# Patient Record
Sex: Female | Born: 1960 | Race: White | Hispanic: No | Smoking: Former smoker
Health system: Southern US, Community
[De-identification: ages and names within clinical notes are randomized; demographics above are authoritative.]

## PROBLEM LIST (undated history)

## (undated) DIAGNOSIS — F319 Bipolar disorder, unspecified: Secondary | ICD-10-CM

## (undated) DIAGNOSIS — J40 Bronchitis, not specified as acute or chronic: Secondary | ICD-10-CM

## (undated) DIAGNOSIS — F909 Attention-deficit hyperactivity disorder, unspecified type: Secondary | ICD-10-CM

## (undated) DIAGNOSIS — F1910 Other psychoactive substance abuse, uncomplicated: Secondary | ICD-10-CM

## (undated) DIAGNOSIS — F988 Other specified behavioral and emotional disorders with onset usually occurring in childhood and adolescence: Secondary | ICD-10-CM

---

## 1998-01-07 ENCOUNTER — Emergency Department (HOSPITAL_COMMUNITY): Admission: EM | Admit: 1998-01-07 | Discharge: 1998-01-08 | Payer: Self-pay | Admitting: Emergency Medicine

## 1998-01-08 ENCOUNTER — Encounter: Payer: Self-pay | Admitting: Emergency Medicine

## 1998-01-18 ENCOUNTER — Encounter: Admission: RE | Admit: 1998-01-18 | Discharge: 1998-04-18 | Payer: Self-pay | Admitting: Orthopedic Surgery

## 1998-06-18 ENCOUNTER — Emergency Department (HOSPITAL_COMMUNITY): Admission: EM | Admit: 1998-06-18 | Discharge: 1998-06-18 | Payer: Self-pay | Admitting: Emergency Medicine

## 1998-06-18 ENCOUNTER — Encounter: Payer: Self-pay | Admitting: Emergency Medicine

## 2000-08-20 ENCOUNTER — Emergency Department (HOSPITAL_COMMUNITY): Admission: EM | Admit: 2000-08-20 | Discharge: 2000-08-21 | Payer: Self-pay | Admitting: Emergency Medicine

## 2000-11-06 ENCOUNTER — Emergency Department (HOSPITAL_COMMUNITY): Admission: EM | Admit: 2000-11-06 | Discharge: 2000-11-06 | Payer: Self-pay | Admitting: Emergency Medicine

## 2000-11-06 ENCOUNTER — Encounter: Payer: Self-pay | Admitting: Emergency Medicine

## 2001-01-22 ENCOUNTER — Emergency Department (HOSPITAL_COMMUNITY): Admission: EM | Admit: 2001-01-22 | Discharge: 2001-01-22 | Payer: Self-pay | Admitting: Emergency Medicine

## 2004-04-19 ENCOUNTER — Emergency Department (HOSPITAL_COMMUNITY): Admission: EM | Admit: 2004-04-19 | Discharge: 2004-04-19 | Payer: Self-pay | Admitting: Emergency Medicine

## 2004-05-12 ENCOUNTER — Encounter: Admission: RE | Admit: 2004-05-12 | Discharge: 2004-05-12 | Payer: Self-pay | Admitting: Emergency Medicine

## 2005-08-09 ENCOUNTER — Encounter: Payer: Self-pay | Admitting: Emergency Medicine

## 2006-04-14 ENCOUNTER — Emergency Department (HOSPITAL_COMMUNITY): Admission: EM | Admit: 2006-04-14 | Discharge: 2006-04-14 | Payer: Self-pay | Admitting: Emergency Medicine

## 2006-06-16 ENCOUNTER — Emergency Department: Payer: Self-pay | Admitting: Emergency Medicine

## 2006-06-17 ENCOUNTER — Inpatient Hospital Stay: Payer: Self-pay | Admitting: Unknown Physician Specialty

## 2006-06-26 ENCOUNTER — Emergency Department: Payer: Self-pay | Admitting: Emergency Medicine

## 2007-08-20 ENCOUNTER — Emergency Department: Payer: Self-pay | Admitting: Internal Medicine

## 2007-08-20 ENCOUNTER — Other Ambulatory Visit: Payer: Self-pay

## 2008-08-24 ENCOUNTER — Emergency Department (HOSPITAL_COMMUNITY): Admission: EM | Admit: 2008-08-24 | Discharge: 2008-08-24 | Payer: Self-pay | Admitting: Family Medicine

## 2010-01-13 ENCOUNTER — Encounter (INDEPENDENT_AMBULATORY_CARE_PROVIDER_SITE_OTHER): Payer: Self-pay | Admitting: *Deleted

## 2010-01-13 ENCOUNTER — Ambulatory Visit: Payer: Self-pay | Admitting: Internal Medicine

## 2010-01-13 LAB — CONVERTED CEMR LAB
AST: 21 units/L (ref 0–37)
Alkaline Phosphatase: 79 units/L (ref 39–117)
BUN: 9 mg/dL (ref 6–23)
Calcium: 10 mg/dL (ref 8.4–10.5)
Chloride: 104 meq/L (ref 96–112)
Creatinine, Ser: 0.85 mg/dL (ref 0.40–1.20)
Free Thyroxine Index: 2.8 (ref 1.0–3.9)
Glucose, Bld: 69 mg/dL — ABNORMAL LOW (ref 70–99)
HCT: 43.2 % (ref 36.0–46.0)
Hemoglobin: 14 g/dL (ref 12.0–15.0)
LDL Cholesterol: 141 mg/dL — ABNORMAL HIGH (ref 0–99)
Lymphocytes Relative: 37 % (ref 12–46)
Lymphs Abs: 1.7 10*3/uL (ref 0.7–4.0)
MCV: 94.5 fL (ref 78.0–100.0)
Potassium: 3.9 meq/L (ref 3.5–5.3)
RDW: 13.3 % (ref 11.5–15.5)
Sodium: 142 meq/L (ref 135–145)
TSH: 1.358 microintl units/mL (ref 0.350–4.500)
Total Bilirubin: 0.3 mg/dL (ref 0.3–1.2)
VLDL: 28 mg/dL (ref 0–40)
WBC: 4.5 10*3/uL (ref 4.0–10.5)

## 2010-03-08 ENCOUNTER — Emergency Department (HOSPITAL_COMMUNITY)
Admission: EM | Admit: 2010-03-08 | Discharge: 2010-03-08 | Payer: Self-pay | Source: Home / Self Care | Admitting: Family Medicine

## 2010-03-08 ENCOUNTER — Encounter: Admission: RE | Admit: 2010-03-08 | Payer: Self-pay | Admitting: Family Medicine

## 2010-07-09 ENCOUNTER — Emergency Department: Payer: Self-pay | Admitting: Emergency Medicine

## 2010-08-13 ENCOUNTER — Inpatient Hospital Stay: Payer: Self-pay | Admitting: Unknown Physician Specialty

## 2010-12-14 ENCOUNTER — Emergency Department (HOSPITAL_COMMUNITY): Payer: Self-pay

## 2010-12-14 ENCOUNTER — Emergency Department (HOSPITAL_COMMUNITY)
Admission: EM | Admit: 2010-12-14 | Discharge: 2010-12-14 | Disposition: A | Discharge status: Home / Self Care | Payer: Self-pay | Attending: Emergency Medicine | Admitting: Emergency Medicine

## 2010-12-14 DIAGNOSIS — R1031 Right lower quadrant pain: Secondary | ICD-10-CM | POA: Insufficient documentation

## 2010-12-14 DIAGNOSIS — R109 Unspecified abdominal pain: Secondary | ICD-10-CM | POA: Insufficient documentation

## 2010-12-14 DIAGNOSIS — R110 Nausea: Secondary | ICD-10-CM | POA: Insufficient documentation

## 2010-12-14 LAB — URINALYSIS, ROUTINE W REFLEX MICROSCOPIC
Glucose, UA: NEGATIVE mg/dL
Hgb urine dipstick: NEGATIVE
Ketones, ur: NEGATIVE mg/dL
Protein, ur: NEGATIVE mg/dL
Specific Gravity, Urine: 1.011 (ref 1.005–1.030)
Urobilinogen, UA: 0.2 mg/dL (ref 0.0–1.0)

## 2010-12-14 LAB — CBC
HCT: 41.2 % (ref 36.0–46.0)
MCHC: 33.3 g/dL (ref 30.0–36.0)
MCV: 92.6 fL (ref 78.0–100.0)
RBC: 4.45 MIL/uL (ref 3.87–5.11)
RDW: 12.3 % (ref 11.5–15.5)
WBC: 5.1 10*3/uL (ref 4.0–10.5)

## 2010-12-14 LAB — COMPREHENSIVE METABOLIC PANEL
ALT: 23 U/L (ref 0–35)
AST: 29 U/L (ref 0–37)
Alkaline Phosphatase: 98 U/L (ref 39–117)
CO2: 31 mEq/L (ref 19–32)
Calcium: 9.6 mg/dL (ref 8.4–10.5)
Creatinine, Ser: 0.73 mg/dL (ref 0.50–1.10)
GFR calc non Af Amer: >60 mL/min (ref >60)
Total Bilirubin: 0.2 mg/dL — ABNORMAL LOW (ref 0.3–1.2)

## 2010-12-14 LAB — DIFFERENTIAL
Basophils Absolute: 0 10*3/uL (ref 0.0–0.1)
Basophils Relative: 1 % (ref 0–1)
Eosinophils Relative: 2 % (ref 0–5)
Lymphocytes Relative: 42 % (ref 12–46)
Neutro Abs: 2.4 10*3/uL (ref 1.7–7.7)

## 2010-12-14 LAB — LIPASE, BLOOD: Lipase: 41 U/L (ref 11–59)

## 2010-12-14 MED ORDER — IOHEXOL 300 MG/ML  SOLN
100.0000 mL | Freq: Once | INTRAMUSCULAR | Status: AC | PRN
  Administered 2010-12-14: 100 mL via INTRAVENOUS

## 2011-08-01 ENCOUNTER — Encounter (HOSPITAL_COMMUNITY): Payer: Self-pay | Admitting: *Deleted

## 2011-08-01 ENCOUNTER — Emergency Department (HOSPITAL_COMMUNITY): Payer: Self-pay

## 2011-08-01 ENCOUNTER — Emergency Department (HOSPITAL_COMMUNITY)
Admission: EM | Admit: 2011-08-01 | Discharge: 2011-08-01 | Disposition: A | Discharge status: Home / Self Care | Payer: Self-pay | Attending: Emergency Medicine | Admitting: Emergency Medicine

## 2011-08-01 DIAGNOSIS — F17200 Nicotine dependence, unspecified, uncomplicated: Secondary | ICD-10-CM | POA: Insufficient documentation

## 2011-08-01 DIAGNOSIS — F319 Bipolar disorder, unspecified: Secondary | ICD-10-CM | POA: Insufficient documentation

## 2011-08-01 DIAGNOSIS — R062 Wheezing: Secondary | ICD-10-CM | POA: Insufficient documentation

## 2011-08-01 DIAGNOSIS — R059 Cough, unspecified: Secondary | ICD-10-CM | POA: Insufficient documentation

## 2011-08-01 DIAGNOSIS — R070 Pain in throat: Secondary | ICD-10-CM | POA: Insufficient documentation

## 2011-08-01 DIAGNOSIS — IMO0001 Reserved for inherently not codable concepts without codable children: Secondary | ICD-10-CM | POA: Insufficient documentation

## 2011-08-01 DIAGNOSIS — R6883 Chills (without fever): Secondary | ICD-10-CM | POA: Insufficient documentation

## 2011-08-01 DIAGNOSIS — J40 Bronchitis, not specified as acute or chronic: Secondary | ICD-10-CM | POA: Insufficient documentation

## 2011-08-01 DIAGNOSIS — R61 Generalized hyperhidrosis: Secondary | ICD-10-CM | POA: Insufficient documentation

## 2011-08-01 DIAGNOSIS — R05 Cough: Secondary | ICD-10-CM | POA: Insufficient documentation

## 2011-08-01 DIAGNOSIS — J3489 Other specified disorders of nose and nasal sinuses: Secondary | ICD-10-CM | POA: Insufficient documentation

## 2011-08-01 HISTORY — DX: Bipolar disorder, unspecified: F31.9

## 2011-08-01 HISTORY — DX: Bronchitis, not specified as acute or chronic: J40

## 2011-08-01 MED ORDER — ALBUTEROL SULFATE (5 MG/ML) 0.5% IN NEBU
5.0000 mg | INHALATION_SOLUTION | Freq: Once | RESPIRATORY_TRACT | Status: AC
  Administered 2011-08-01: 5 mg via RESPIRATORY_TRACT
  Filled 2011-08-01: qty 1

## 2011-08-01 MED ORDER — PREDNISONE 20 MG PO TABS
60.0000 mg | ORAL_TABLET | Freq: Once | ORAL | Status: AC
  Administered 2011-08-01: 60 mg via ORAL
  Filled 2011-08-01: qty 3

## 2011-08-01 MED ORDER — PREDNISONE 20 MG PO TABS: 60.0000 mg | ORAL_TABLET | Freq: Every day | ORAL | Status: DC

## 2011-08-01 MED ORDER — IPRATROPIUM BROMIDE 0.02 % IN SOLN
0.5000 mg | Freq: Once | RESPIRATORY_TRACT | Status: AC
  Administered 2011-08-01: 0.5 mg via RESPIRATORY_TRACT
  Filled 2011-08-01: qty 2.5

## 2011-08-01 MED ORDER — ALBUTEROL SULFATE HFA 108 (90 BASE) MCG/ACT IN AERS
2.0000 | INHALATION_SPRAY | RESPIRATORY_TRACT | Status: DC
  Administered 2011-08-01: 2 via RESPIRATORY_TRACT
  Filled 2011-08-01: qty 6.7

## 2011-08-01 NOTE — ED Notes (Signed)
Patient reports she has had productive cough for 3 days.  She states she has had pink colored sputum.  Patient has cough noted in triage. Patient also reports she has sob.  She states she is weak.  Patient unsure if she has had a fever

## 2011-08-01 NOTE — ED Provider Notes (Signed)
History     CSN: [id number]  Arrival date & time 08/01/11  7829   First MD Initiated Contact with Patient 08/01/11 0920      Chief Complaint  Patient presents with  . Cough  . Hemoptysis    (Consider location/radiation/quality/duration/timing/severity/associated sxs/prior treatment) Patient is a 51 y.o. female presenting with cough. The history is provided by the patient.  Cough This is a new problem. The current episode started more than 1 week ago. The problem occurs constantly. The problem has been gradually worsening. The cough is productive of sputum. There has been no fever. Associated symptoms include chills, sweats, rhinorrhea, sore throat, myalgias and wheezing. Pertinent negatives include no chest pain and no shortness of breath. She has tried nothing for the symptoms. She is a smoker. Her past medical history is significant for bronchitis.  Pt states her cough is worsening, noted her sputum to be pink today, but states also had a nose bleed last night. Denies taking any medications. Pt is a heavy smoker. Denies chest pain, SOB, denies recent travel, LE swelling.   Past Medical History  Diagnosis Date  . Bronchitis   . Bipolar 1 disorder     History reviewed. No pertinent past surgical history.  No family history on file.  History  Substance Use Topics  . Smoking status: Current Everyday Smoker  . Smokeless tobacco: Not on file  . Alcohol Use: Yes    OB History    Grav Para Term Preterm Abortions TAB SAB Ect Mult Living                  Review of Systems  Constitutional: Positive for chills. Negative for fever.  HENT: Positive for congestion, sore throat and rhinorrhea. Negative for neck stiffness.   Respiratory: Positive for cough and wheezing. Negative for chest tightness and shortness of breath.   Cardiovascular: Negative for chest pain and leg swelling.  Gastrointestinal: Negative.   Genitourinary: Negative.   Musculoskeletal: Positive for myalgias.    Skin: Negative.   Neurological: Negative for dizziness and weakness.    Allergies  Darvocet  Home Medications   Current Outpatient Rx  Name Route Sig Dispense Refill  . TYLENOL CHEST CONGESTION PO Oral Take 2 tablets by mouth every 6 (six) hours as needed. For cold symptoms      BP 111/69  Pulse 102  Temp(Src) 98.4 F (36.9 C) (Oral)  Resp 28  Ht 5' (1.524 m)  Wt 119 lb (53.978 kg)  BMI 23.24 kg/m2  SpO2 98%  Physical Exam  Nursing note and vitals reviewed. Constitutional: She is oriented to person, place, and time. She appears well-developed and well-nourished.  HENT:  Head: Normocephalic and atraumatic.  Right Ear: External ear normal.  Left Ear: External ear normal.  Nose: Nose normal.  Mouth/Throat: Oropharynx is clear and moist.  Eyes: Conjunctivae are normal.  Neck: Neck supple.  Cardiovascular: Normal rate, regular rhythm and normal heart sounds.   Pulmonary/Chest: Effort normal. She has wheezes.       ptin no respiratory distress. Speaking complete sentences. Expiratory wheezing on exam. Pt coughing.  Abdominal: Soft. Bowel sounds are normal. She exhibits no distension. There is no tenderness.  Musculoskeletal: She exhibits no edema.  Neurological: She is alert and oriented to person, place, and time.  Skin: Skin is warm and dry.  Psychiatric: She has a normal mood and affect.    ED Course  Procedures (including critical care time)  Labs Reviewed - No data to display  Dg Chest 2 View  08/01/2011  *RADIOLOGY REPORT*  Clinical Data: Cough, hemoptysis  CHEST - 2 VIEW  Comparison: Chest radiograph 11/29 1011  Findings: Stable cardiac silhouette.  Lungs are hyperinflated.  No suspicious pulmonary nodule.  No consolidation or pneumothorax.  No pleural fluid. There is scoliosis of the upper thoracic spine.  IMPRESSION: No acute cardiopulmonary process.  Emphysematous change.  Original Report Authenticated By: Genevive Bi, M.D.   Pt with persistent cough and  wheezing. She is a heavy smoker. She is not having CP, or SOB, on exam not tachycardic, not hypoxic, doubt PE. Atypical for ACS. Pt received a neb treatment and feeling much better. Will treat with inhaler and steroids. Pt's oxygen sat 98% on RA, she feels like she can go home with an inhaler. I offered her another neb treatment and she refused. Will d/c with follow up.     1. Bronchitis       MDM          Lottie Mussel, PA 08/01/11 1246

## 2011-08-01 NOTE — Discharge Instructions (Signed)
Take albuterol inhaler 2 puffs every 4 hrs. Take over the counter cough medications. Take prednisone daily starting tomorrow. Avoid smoking. Follow up with primary care doctor or urgent care if not improving.   RESOURCE GUIDE  Dental Problems  Patients with Medicaid: [facility] [phone] [address]                                           [phone] [address] Phone:  [phone]                                                  Phone:  [phone]  If unable to pay or uninsured, contact:  [facility] or [facility]. to become qualified for the adult dental clinic.  Chronic Pain Problems Contact [facility]  [phone] Patients need to be referred by their primary care doctor.  Insufficient Money for Medicine Contact [employer]:  call "211" or [facility] [phone].  No Primary Care Doctor Call [facility]  [phone] Other agencies that provide inexpensive medical care    [facility]  [phone]    [facility]  [phone]    [facility]  [phone]    [facility]  [phone]    [facility]  [phone]    [facility]  [phone]  [facility]  [phone] [facility]  [phone] [facility]   [phone] (emergency services [phone])  Substance Abuse Resources [facility]  [phone] [facility] [phone] [facility] [phone] Floyd[facility] Flock [phone] Residential & Outpatient Substance Abuse Program  [phone]  Abuse/Neglect [facility] Child Abuse Hotline 519-575-6657 [facility] Child Abuse Hotline (864) 017-2592 (After Hours)  Emergency Shelter [facility] [phone]  M[facility] [phone] [facility] [phone]  MRSA Hotline #:    670-835-8718    [facility] Resources  [facility]     [employer]                          [facility] Dept. [address] [place]                       [address]      [address]  [place]                                                Cristobal Goldmann Phone:  [phone]  Phone:  [phone]                 Phone:  [phone]  [place] [facility] Phone:  [phone]  [place] Child Abuse Hotline (336) 342-1394 [phone]) [phone] (After Hours)   

## 2011-08-02 NOTE — ED Provider Notes (Signed)
Medical screening examination/treatment/procedure(s) were performed by non-physician practitioner and as supervising physician I was immediately available for consultation/collaboration.  Toy Baker, MD 08/02/11 [phone]

## 2012-01-04 ENCOUNTER — Emergency Department: Payer: Self-pay | Admitting: Emergency Medicine

## 2012-01-04 LAB — CBC
HCT: 41.5 % (ref 35.0–47.0)
HGB: 14.5 g/dL (ref 12.0–16.0)
MCH: 31.6 pg (ref 26.0–34.0)
MCHC: 35 g/dL (ref 32.0–36.0)
MCV: 90 fL (ref 80–100)
Platelet: 281 10*3/uL (ref 150–440)
RBC: 4.59 10*6/uL (ref 3.80–5.20)
RDW: 12.9 % (ref 11.5–14.5)

## 2012-01-04 LAB — COMPREHENSIVE METABOLIC PANEL
Albumin: 3.4 g/dL (ref 3.4–5.0)
Alkaline Phosphatase: 93 U/L (ref 50–136)
Anion Gap: 9 (ref 7–16)
Bilirubin,Total: 0.3 mg/dL (ref 0.2–1.0)
Calcium, Total: 8.8 mg/dL (ref 8.5–10.1)
Chloride: 104 mmol/L (ref 98–107)
Co2: 27 mmol/L (ref 21–32)
Creatinine: 0.87 mg/dL (ref 0.60–1.30)
EGFR (Non-African Amer.): >60
Potassium: 3.6 mmol/L (ref 3.5–5.1)
SGOT(AST): 25 U/L (ref 15–37)

## 2012-01-04 LAB — TROPONIN I: Troponin-I: <0.02 ng/mL

## 2012-05-29 ENCOUNTER — Emergency Department: Payer: Self-pay | Admitting: Emergency Medicine

## 2012-05-29 LAB — BASIC METABOLIC PANEL
Anion Gap: 10 (ref 7–16)
BUN: 6 mg/dL — ABNORMAL LOW (ref 7–18)
Calcium, Total: 8.9 mg/dL (ref 8.5–10.1)
Creatinine: 0.76 mg/dL (ref 0.60–1.30)
Glucose: 95 mg/dL (ref 65–99)
Potassium: 3.6 mmol/L (ref 3.5–5.1)
Sodium: 138 mmol/L (ref 136–145)

## 2012-05-29 LAB — CBC
HCT: 41.1 % (ref 35.0–47.0)
MCHC: 34.3 g/dL (ref 32.0–36.0)
Platelet: 286 10*3/uL (ref 150–440)
RDW: 13.3 % (ref 11.5–14.5)
WBC: 6.8 10*3/uL (ref 3.6–11.0)

## 2012-09-08 IMAGING — CR DG CHEST 2V
2 series · 2 of 2 positions shown · non-contrast
Comparison: August 24, 2008

CLINICAL DATA: Chest and left arm pain; cough; smoker

CHEST - 2 VIEW

[view not recorded (1 of 2)]
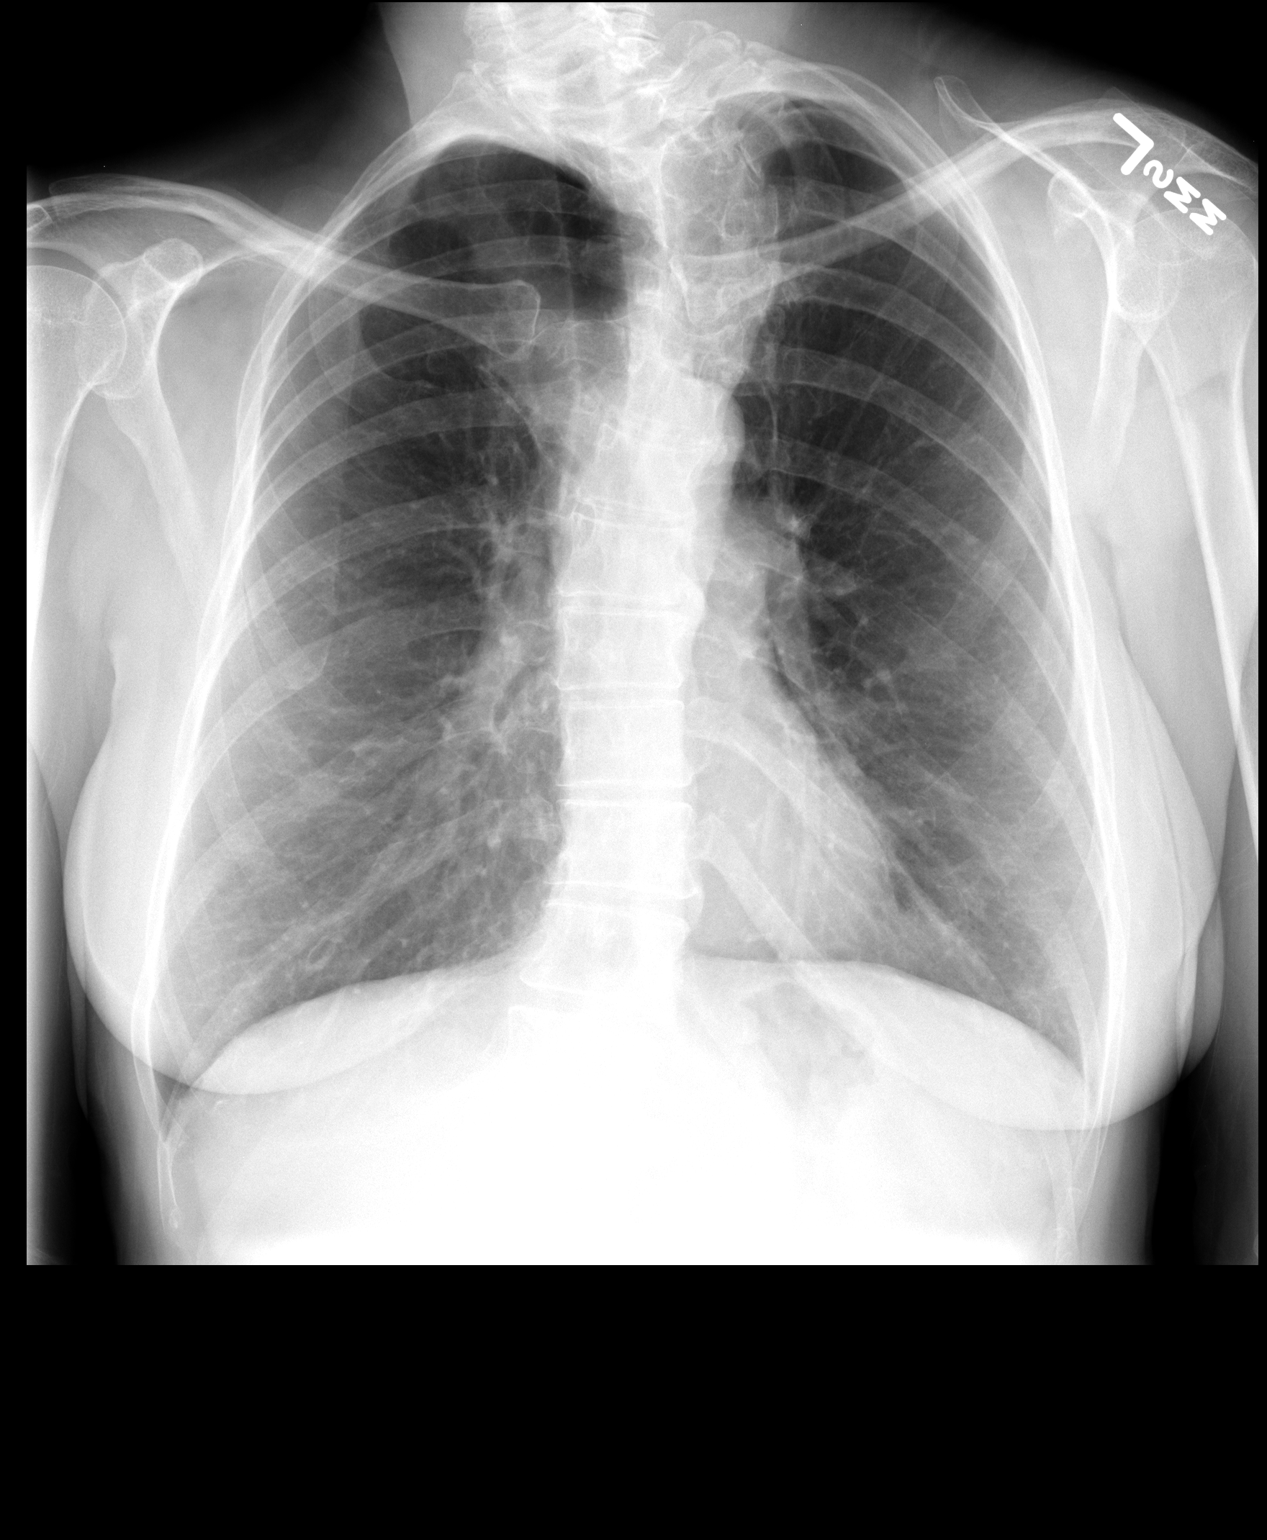

[view not recorded (2 of 2)]
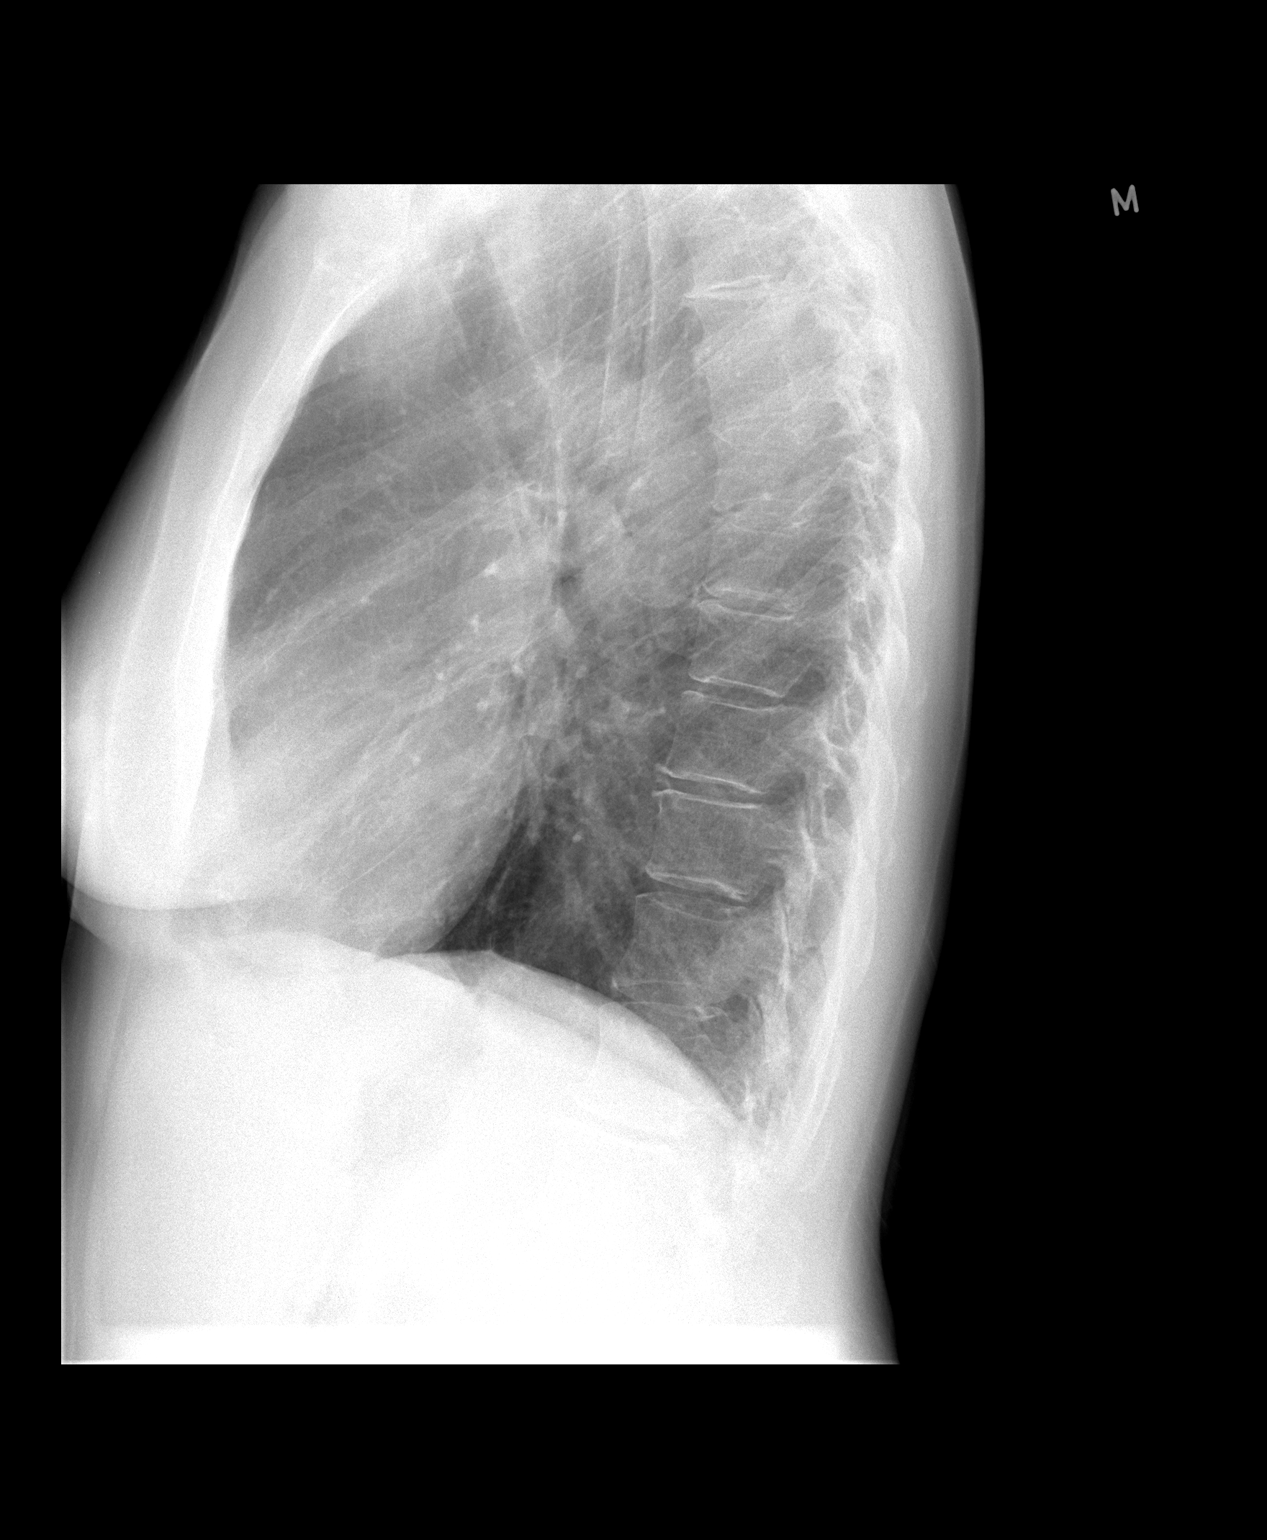

[2 of 2 positions shown; findings below may reference images not displayed]

FINDINGS: The cardiac silhouette, mediastinum, pulmonary
vasculature are within normal limits.  Both lungs are clear.
There is no acute bony abnormality.  Pronounced thoracolumbar
scoliosis is noted.
IMPRESSION: There is no evidence of acute cardiac or pulmonary process.

## 2012-09-23 ENCOUNTER — Other Ambulatory Visit: Payer: Self-pay | Admitting: *Deleted

## 2012-09-23 DIAGNOSIS — N63 Unspecified lump in unspecified breast: Secondary | ICD-10-CM

## 2012-09-24 ENCOUNTER — Ambulatory Visit (HOSPITAL_COMMUNITY): Payer: Self-pay

## 2012-09-30 ENCOUNTER — Emergency Department: Payer: Self-pay | Admitting: Emergency Medicine

## 2012-10-04 ENCOUNTER — Other Ambulatory Visit: Payer: Self-pay

## 2012-11-16 ENCOUNTER — Emergency Department (HOSPITAL_COMMUNITY): Payer: Self-pay

## 2012-11-16 ENCOUNTER — Emergency Department (HOSPITAL_COMMUNITY)
Admission: EM | Admit: 2012-11-16 | Discharge: 2012-11-16 | Disposition: A | Discharge status: Home / Self Care | Payer: No Typology Code available for payment source | Attending: Emergency Medicine | Admitting: Emergency Medicine

## 2012-11-16 ENCOUNTER — Encounter (HOSPITAL_COMMUNITY): Payer: Self-pay

## 2012-11-16 DIAGNOSIS — S59909A Unspecified injury of unspecified elbow, initial encounter: Secondary | ICD-10-CM | POA: Insufficient documentation

## 2012-11-16 DIAGNOSIS — Z8709 Personal history of other diseases of the respiratory system: Secondary | ICD-10-CM | POA: Insufficient documentation

## 2012-11-16 DIAGNOSIS — M25532 Pain in left wrist: Secondary | ICD-10-CM

## 2012-11-16 DIAGNOSIS — S199XXA Unspecified injury of neck, initial encounter: Secondary | ICD-10-CM | POA: Insufficient documentation

## 2012-11-16 DIAGNOSIS — R079 Chest pain, unspecified: Secondary | ICD-10-CM

## 2012-11-16 DIAGNOSIS — S0990XA Unspecified injury of head, initial encounter: Secondary | ICD-10-CM | POA: Insufficient documentation

## 2012-11-16 DIAGNOSIS — S6990XA Unspecified injury of unspecified wrist, hand and finger(s), initial encounter: Secondary | ICD-10-CM | POA: Insufficient documentation

## 2012-11-16 DIAGNOSIS — S0993XA Unspecified injury of face, initial encounter: Secondary | ICD-10-CM | POA: Insufficient documentation

## 2012-11-16 DIAGNOSIS — M25512 Pain in left shoulder: Secondary | ICD-10-CM

## 2012-11-16 DIAGNOSIS — F319 Bipolar disorder, unspecified: Secondary | ICD-10-CM | POA: Insufficient documentation

## 2012-11-16 DIAGNOSIS — S20219A Contusion of unspecified front wall of thorax, initial encounter: Secondary | ICD-10-CM | POA: Insufficient documentation

## 2012-11-16 DIAGNOSIS — Y92410 Unspecified street and highway as the place of occurrence of the external cause: Secondary | ICD-10-CM | POA: Insufficient documentation

## 2012-11-16 DIAGNOSIS — F17200 Nicotine dependence, unspecified, uncomplicated: Secondary | ICD-10-CM | POA: Insufficient documentation

## 2012-11-16 DIAGNOSIS — S46909A Unspecified injury of unspecified muscle, fascia and tendon at shoulder and upper arm level, unspecified arm, initial encounter: Secondary | ICD-10-CM | POA: Insufficient documentation

## 2012-11-16 DIAGNOSIS — J4489 Other specified chronic obstructive pulmonary disease: Secondary | ICD-10-CM | POA: Insufficient documentation

## 2012-11-16 DIAGNOSIS — S4980XA Other specified injuries of shoulder and upper arm, unspecified arm, initial encounter: Secondary | ICD-10-CM | POA: Insufficient documentation

## 2012-11-16 DIAGNOSIS — Y9389 Activity, other specified: Secondary | ICD-10-CM | POA: Insufficient documentation

## 2012-11-16 DIAGNOSIS — T148XXA Other injury of unspecified body region, initial encounter: Secondary | ICD-10-CM

## 2012-11-16 DIAGNOSIS — J449 Chronic obstructive pulmonary disease, unspecified: Secondary | ICD-10-CM | POA: Insufficient documentation

## 2012-11-16 DIAGNOSIS — M542 Cervicalgia: Secondary | ICD-10-CM

## 2012-11-16 MED ORDER — OXYCODONE-ACETAMINOPHEN 5-325 MG PO TABS
1.0000 | ORAL_TABLET | Freq: Once | ORAL | Status: AC
  Administered 2012-11-16: 1 via ORAL
  Filled 2012-11-16: qty 1

## 2012-11-16 MED ORDER — OXYCODONE-ACETAMINOPHEN 5-325 MG PO TABS: 1.0000 | ORAL_TABLET | ORAL | Status: DC | PRN

## 2012-11-16 MED ORDER — IBUPROFEN 600 MG PO TABS: 600.0000 mg | ORAL_TABLET | Freq: Four times a day (QID) | ORAL | Status: DC | PRN

## 2012-11-16 MED ORDER — ONDANSETRON 4 MG PO TBDP
4.0000 mg | ORAL_TABLET | Freq: Once | ORAL | Status: AC
  Administered 2012-11-16: 4 mg via ORAL
  Filled 2012-11-16: qty 1

## 2012-11-16 MED ORDER — ONDANSETRON HCL 4 MG PO TABS: 4.0000 mg | ORAL_TABLET | Freq: Four times a day (QID) | ORAL | Status: DC
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
## 2012-11-16 NOTE — ED Notes (Signed)
The pt is out of the dept.  ???? IN XRAY
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
## 2012-11-24 ENCOUNTER — Emergency Department (HOSPITAL_COMMUNITY): Payer: Self-pay

## 2012-11-24 ENCOUNTER — Emergency Department (HOSPITAL_COMMUNITY)
Admission: EM | Admit: 2012-11-24 | Discharge: 2012-11-24 | Disposition: A | Discharge status: Home / Self Care | Payer: Self-pay | Attending: Emergency Medicine | Admitting: Emergency Medicine

## 2012-11-24 ENCOUNTER — Encounter (HOSPITAL_COMMUNITY): Payer: Self-pay | Admitting: Emergency Medicine

## 2012-11-24 DIAGNOSIS — R079 Chest pain, unspecified: Secondary | ICD-10-CM

## 2012-11-24 DIAGNOSIS — R05 Cough: Secondary | ICD-10-CM | POA: Insufficient documentation

## 2012-11-24 DIAGNOSIS — Z8659 Personal history of other mental and behavioral disorders: Secondary | ICD-10-CM | POA: Insufficient documentation

## 2012-11-24 DIAGNOSIS — R059 Cough, unspecified: Secondary | ICD-10-CM | POA: Insufficient documentation

## 2012-11-24 DIAGNOSIS — F17200 Nicotine dependence, unspecified, uncomplicated: Secondary | ICD-10-CM | POA: Insufficient documentation

## 2012-11-24 DIAGNOSIS — R0789 Other chest pain: Secondary | ICD-10-CM | POA: Insufficient documentation

## 2012-11-24 DIAGNOSIS — M546 Pain in thoracic spine: Secondary | ICD-10-CM | POA: Insufficient documentation

## 2012-11-24 DIAGNOSIS — Z87828 Personal history of other (healed) physical injury and trauma: Secondary | ICD-10-CM | POA: Insufficient documentation

## 2012-11-24 DIAGNOSIS — Z720 Tobacco use: Secondary | ICD-10-CM

## 2012-11-24 DIAGNOSIS — J441 Chronic obstructive pulmonary disease with (acute) exacerbation: Secondary | ICD-10-CM | POA: Insufficient documentation

## 2012-11-24 DIAGNOSIS — J449 Chronic obstructive pulmonary disease, unspecified: Secondary | ICD-10-CM

## 2012-11-24 LAB — CBC
HCT: 45.3 % (ref 36.0–46.0)
Hemoglobin: 15.4 g/dL — ABNORMAL HIGH (ref 12.0–15.0)
MCH: 30.9 pg (ref 26.0–34.0)
MCV: 91 fL (ref 78.0–100.0)
RBC: 4.98 MIL/uL (ref 3.87–5.11)
WBC: 5.6 10*3/uL (ref 4.0–10.5)

## 2012-11-24 LAB — POCT I-STAT TROPONIN I: Troponin i, poc: 0.01 ng/mL (ref 0.00–0.08)

## 2012-11-24 LAB — BASIC METABOLIC PANEL
CO2: 25 mEq/L (ref 19–32)
Calcium: 9.9 mg/dL (ref 8.4–10.5)
Chloride: 104 mEq/L (ref 96–112)
Creatinine, Ser: 0.73 mg/dL (ref 0.50–1.10)
Glucose, Bld: 63 mg/dL — ABNORMAL LOW (ref 70–99)

## 2012-11-24 LAB — PRO B NATRIURETIC PEPTIDE: Pro B Natriuretic peptide (BNP): 57.7 pg/mL (ref 0–125)

## 2012-11-24 MED ORDER — TRAMADOL HCL 50 MG PO TABS: 50.0000 mg | ORAL_TABLET | Freq: Four times a day (QID) | ORAL | Status: DC | PRN

## 2012-11-24 MED ORDER — IPRATROPIUM BROMIDE 0.02 % IN SOLN
0.5000 mg | Freq: Once | RESPIRATORY_TRACT | Status: AC
  Administered 2012-11-24: 0.5 mg via RESPIRATORY_TRACT
  Filled 2012-11-24: qty 2.5

## 2012-11-24 MED ORDER — ALBUTEROL SULFATE (5 MG/ML) 0.5% IN NEBU
5.0000 mg | INHALATION_SOLUTION | Freq: Once | RESPIRATORY_TRACT | Status: AC
  Administered 2012-11-24: 5 mg via RESPIRATORY_TRACT
  Filled 2012-11-24: qty 1

## 2012-11-24 MED ORDER — TRAMADOL HCL 50 MG PO TABS
50.0000 mg | ORAL_TABLET | Freq: Once | ORAL | Status: AC
  Administered 2012-11-24: 50 mg via ORAL
  Filled 2012-11-24: qty 1

## 2012-11-24 MED ORDER — ALBUTEROL SULFATE HFA 108 (90 BASE) MCG/ACT IN AERS
2.0000 | INHALATION_SPRAY | RESPIRATORY_TRACT | Status: DC | PRN
  Administered 2012-11-24: 2 via RESPIRATORY_TRACT
  Filled 2012-11-24: qty 6.7

## 2012-11-24 NOTE — ED Notes (Signed)
Pt states "i was given Percocet, IBU, the percocet helped a lot, but i had to take two of them at a time, my pain is 5 times worse"

## 2012-11-24 NOTE — ED Provider Notes (Signed)
Medical screening examination/treatment/procedure(s) were performed by non-physician practitioner and as supervising physician I was immediately available for consultation/collaboration.   Richardean Canal, MD 11/24/12 [phone]
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
## 2012-11-25 LAB — GLUCOSE, CAPILLARY: Glucose-Capillary: 114 mg/dL — ABNORMAL HIGH (ref 70–99)

## 2013-01-09 IMAGING — CR DG CHEST 1V PORT
1 series · 1 of 1 positions shown · non-contrast
Comparison: none

REASON FOR EXAM: chest pain
COMMENTS:

PROCEDURE:     DXR - DXR PORTABLE CHEST SINGLE VIEW  - July 09, 2010  [DATE]
RESULT:     No acute cardiopulmonary disease.

[view not recorded]
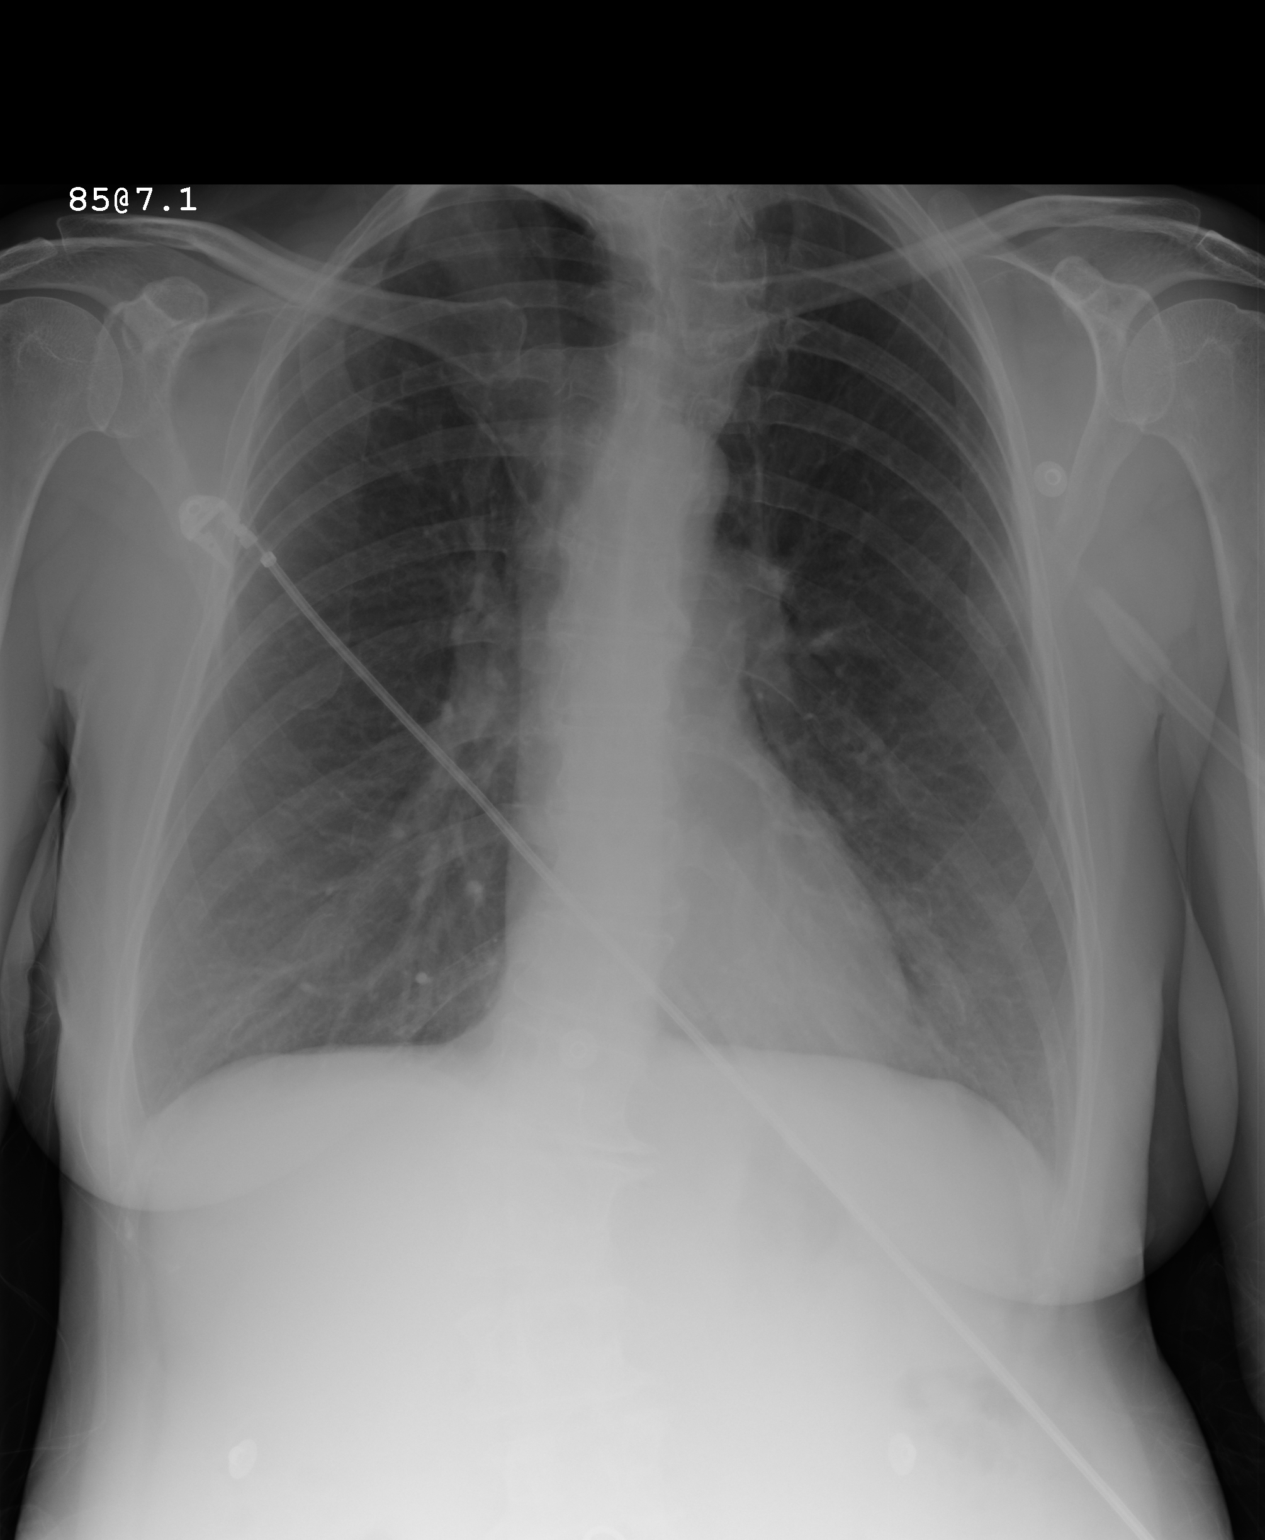

[1 of 1 positions shown; findings below may reference images not displayed]

IMPRESSION: No acute abnormality.

## 2013-04-08 ENCOUNTER — Emergency Department: Payer: Self-pay | Admitting: Emergency Medicine

## 2013-04-08 LAB — BASIC METABOLIC PANEL
Anion Gap: 3 — ABNORMAL LOW (ref 7–16)
BUN: 5 mg/dL — ABNORMAL LOW (ref 7–18)
Calcium, Total: 8.6 mg/dL (ref 8.5–10.1)
Co2: 23 mmol/L (ref 21–32)
Creatinine: 0.65 mg/dL (ref 0.60–1.30)
EGFR (African American): >60

## 2013-04-08 LAB — CBC
HCT: 41.7 % (ref 35.0–47.0)
HGB: 14.4 g/dL (ref 12.0–16.0)
MCH: 31 pg (ref 26.0–34.0)
MCV: 90 fL (ref 80–100)
RBC: 4.65 10*6/uL (ref 3.80–5.20)
RDW: 13.4 % (ref 11.5–14.5)
WBC: 7.8 10*3/uL (ref 3.6–11.0)

## 2013-04-08 LAB — TROPONIN I: Troponin-I: <0.02 ng/mL

## 2014-04-13 ENCOUNTER — Encounter (HOSPITAL_COMMUNITY): Payer: Self-pay | Admitting: Emergency Medicine

## 2014-04-13 ENCOUNTER — Emergency Department (HOSPITAL_COMMUNITY)
Admission: EM | Admit: 2014-04-13 | Discharge: 2014-04-14 | Disposition: A | Discharge status: Home / Self Care | Payer: Medicaid Other | Attending: Emergency Medicine | Admitting: Emergency Medicine

## 2014-04-13 DIAGNOSIS — Z720 Tobacco use: Secondary | ICD-10-CM | POA: Insufficient documentation

## 2014-04-13 DIAGNOSIS — Z8709 Personal history of other diseases of the respiratory system: Secondary | ICD-10-CM | POA: Insufficient documentation

## 2014-04-13 DIAGNOSIS — R0602 Shortness of breath: Secondary | ICD-10-CM | POA: Insufficient documentation

## 2014-04-13 DIAGNOSIS — A5901 Trichomonal vulvovaginitis: Secondary | ICD-10-CM

## 2014-04-13 DIAGNOSIS — F1420 Cocaine dependence, uncomplicated: Secondary | ICD-10-CM | POA: Insufficient documentation

## 2014-04-13 DIAGNOSIS — F319 Bipolar disorder, unspecified: Secondary | ICD-10-CM | POA: Diagnosis present

## 2014-04-13 DIAGNOSIS — R0789 Other chest pain: Secondary | ICD-10-CM | POA: Insufficient documentation

## 2014-04-13 DIAGNOSIS — F1010 Alcohol abuse, uncomplicated: Secondary | ICD-10-CM

## 2014-04-13 DIAGNOSIS — Z79899 Other long term (current) drug therapy: Secondary | ICD-10-CM | POA: Insufficient documentation

## 2014-04-13 DIAGNOSIS — R062 Wheezing: Secondary | ICD-10-CM | POA: Insufficient documentation

## 2014-04-13 DIAGNOSIS — Z3202 Encounter for pregnancy test, result negative: Secondary | ICD-10-CM | POA: Insufficient documentation

## 2014-04-13 DIAGNOSIS — F1920 Other psychoactive substance dependence, uncomplicated: Secondary | ICD-10-CM

## 2014-04-13 DIAGNOSIS — F1910 Other psychoactive substance abuse, uncomplicated: Secondary | ICD-10-CM

## 2014-04-13 DIAGNOSIS — F1020 Alcohol dependence, uncomplicated: Secondary | ICD-10-CM | POA: Diagnosis present

## 2014-04-13 DIAGNOSIS — F1220 Cannabis dependence, uncomplicated: Secondary | ICD-10-CM | POA: Insufficient documentation

## 2014-04-13 LAB — RAPID URINE DRUG SCREEN, HOSP PERFORMED
AMPHETAMINES: NOT DETECTED
BARBITURATES: NOT DETECTED
BENZODIAZEPINES: NOT DETECTED
COCAINE: POSITIVE — AB
Opiates: NOT DETECTED
Tetrahydrocannabinol: POSITIVE — AB

## 2014-04-13 LAB — CBC WITH DIFFERENTIAL/PLATELET
Basophils Absolute: 0 10*3/uL (ref 0.0–0.1)
Basophils Relative: 1 % (ref 0–1)
Eosinophils Absolute: 0 10*3/uL (ref 0.0–0.7)
Eosinophils Relative: 1 % (ref 0–5)
HCT: 39.3 % (ref 36.0–46.0)
Hemoglobin: 13 g/dL (ref 12.0–15.0)
LYMPHS PCT: 30 % (ref 12–46)
Lymphs Abs: 1.6 10*3/uL (ref 0.7–4.0)
MCH: 31.3 pg (ref 26.0–34.0)
MCHC: 33.1 g/dL (ref 30.0–36.0)
MCV: 94.5 fL (ref 78.0–100.0)
Monocytes Absolute: 0.2 10*3/uL (ref 0.1–1.0)
Monocytes Relative: 4 % (ref 3–12)
NEUTROS PCT: 64 % (ref 43–77)
Neutro Abs: 3.4 10*3/uL (ref 1.7–7.7)
PLATELETS: 304 10*3/uL (ref 150–400)
RBC: 4.16 MIL/uL (ref 3.87–5.11)
RDW: 13.2 % (ref 11.5–15.5)
WBC: 5.2 10*3/uL (ref 4.0–10.5)

## 2014-04-13 LAB — COMPREHENSIVE METABOLIC PANEL
ALBUMIN: 3.8 g/dL (ref 3.5–5.2)
ALK PHOS: 81 U/L (ref 39–117)
ALT: 21 U/L (ref 0–35)
AST: 33 U/L (ref 0–37)
Anion gap: 7 (ref 5–15)
BILIRUBIN TOTAL: 0.3 mg/dL (ref 0.3–1.2)
BUN: 12 mg/dL (ref 6–23)
CO2: 21 mmol/L (ref 19–32)
Calcium: 8.6 mg/dL (ref 8.4–10.5)
Chloride: 107 mEq/L (ref 96–112)
Creatinine, Ser: 0.61 mg/dL (ref 0.50–1.10)
GFR calc Af Amer: >90 mL/min (ref >90)
Glucose, Bld: 114 mg/dL — ABNORMAL HIGH (ref 70–99)
POTASSIUM: 3.7 mmol/L (ref 3.5–5.1)
SODIUM: 135 mmol/L (ref 135–145)
Total Protein: 6.5 g/dL (ref 6.0–8.3)

## 2014-04-13 LAB — URINALYSIS, ROUTINE W REFLEX MICROSCOPIC
BILIRUBIN URINE: NEGATIVE
GLUCOSE, UA: NEGATIVE mg/dL
HGB URINE DIPSTICK: NEGATIVE
Ketones, ur: NEGATIVE mg/dL
Leukocytes, UA: NEGATIVE
Nitrite: NEGATIVE
PROTEIN: NEGATIVE mg/dL
Specific Gravity, Urine: 1.013 (ref 1.005–1.030)
Urobilinogen, UA: 0.2 mg/dL (ref 0.0–1.0)
pH: 5.5 (ref 5.0–8.0)

## 2014-04-13 LAB — WET PREP, GENITAL: YEAST WET PREP: NONE SEEN

## 2014-04-13 LAB — POC URINE PREG, ED: PREG TEST UR: NEGATIVE

## 2014-04-13 LAB — ETHANOL: ALCOHOL ETHYL (B): 69 mg/dL — AB (ref 0–9)

## 2014-04-13 MED ORDER — ALBUTEROL SULFATE HFA 108 (90 BASE) MCG/ACT IN AERS
2.0000 | INHALATION_SPRAY | RESPIRATORY_TRACT | Status: DC | PRN
  Administered 2014-04-14: 2 via RESPIRATORY_TRACT
  Filled 2014-04-13 (×2): qty 6.7

## 2014-04-13 MED ORDER — CEFTRIAXONE SODIUM 250 MG IJ SOLR
250.0000 mg | Freq: Once | INTRAMUSCULAR | Status: AC
  Administered 2014-04-13: 250 mg via INTRAMUSCULAR
  Filled 2014-04-13: qty 250

## 2014-04-13 MED ORDER — NAPROXEN 500 MG PO TABS: 500.0000 mg | ORAL_TABLET | Freq: Two times a day (BID) | ORAL | Status: DC | PRN

## 2014-04-13 MED ORDER — ONDANSETRON 4 MG PO TBDP: 4.0000 mg | ORAL_TABLET | Freq: Four times a day (QID) | ORAL | Status: DC | PRN

## 2014-04-13 MED ORDER — CLONIDINE HCL 0.1 MG PO TABS
0.1000 mg | ORAL_TABLET | Freq: Four times a day (QID) | ORAL | Status: DC
  Administered 2014-04-13 (×2): 0.1 mg via ORAL
  Filled 2014-04-13 (×2): qty 1

## 2014-04-13 MED ORDER — METHOCARBAMOL 500 MG PO TABS: 500.0000 mg | ORAL_TABLET | Freq: Three times a day (TID) | ORAL | Status: DC | PRN

## 2014-04-13 MED ORDER — DICYCLOMINE HCL 20 MG PO TABS
20.0000 mg | ORAL_TABLET | Freq: Four times a day (QID) | ORAL | Status: DC | PRN
  Filled 2014-04-13: qty 1

## 2014-04-13 MED ORDER — CLONIDINE HCL 0.1 MG PO TABS: 0.1000 mg | ORAL_TABLET | Freq: Every day | ORAL | Status: DC

## 2014-04-13 MED ORDER — ONDANSETRON HCL 4 MG PO TABS: 4.0000 mg | ORAL_TABLET | Freq: Three times a day (TID) | ORAL | Status: DC | PRN

## 2014-04-13 MED ORDER — LIDOCAINE HCL 1 % IJ SOLN
INTRAMUSCULAR | Status: AC
  Administered 2014-04-13: 20:00:00
  Filled 2014-04-13: qty 20

## 2014-04-13 MED ORDER — CLONIDINE HCL 0.1 MG PO TABS: 0.1000 mg | ORAL_TABLET | ORAL | Status: DC

## 2014-04-13 MED ORDER — LORAZEPAM 1 MG PO TABS: 0.0000 mg | ORAL_TABLET | Freq: Two times a day (BID) | ORAL | Status: DC

## 2014-04-13 MED ORDER — LORAZEPAM 1 MG PO TABS
0.0000 mg | ORAL_TABLET | Freq: Four times a day (QID) | ORAL | Status: DC
  Administered 2014-04-13 – 2014-04-14 (×3): 2 mg via ORAL
  Filled 2014-04-13 (×3): qty 2

## 2014-04-13 MED ORDER — VITAMIN B-1 100 MG PO TABS
100.0000 mg | ORAL_TABLET | Freq: Every day | ORAL | Status: DC
  Administered 2014-04-13 – 2014-04-14 (×2): 100 mg via ORAL
  Filled 2014-04-13 (×2): qty 1

## 2014-04-13 MED ORDER — IBUPROFEN 200 MG PO TABS
600.0000 mg | ORAL_TABLET | Freq: Three times a day (TID) | ORAL | Status: DC | PRN
  Administered 2014-04-14: 600 mg via ORAL
  Filled 2014-04-13: qty 3

## 2014-04-13 MED ORDER — HYDROXYZINE HCL 25 MG PO TABS: 25.0000 mg | ORAL_TABLET | Freq: Four times a day (QID) | ORAL | Status: DC | PRN

## 2014-04-13 MED ORDER — ZOLPIDEM TARTRATE 5 MG PO TABS: 5.0000 mg | ORAL_TABLET | Freq: Every evening | ORAL | Status: DC | PRN

## 2014-04-13 MED ORDER — AZITHROMYCIN 250 MG PO TABS
1000.0000 mg | ORAL_TABLET | Freq: Once | ORAL | Status: AC
  Administered 2014-04-13: 1000 mg via ORAL
  Filled 2014-04-13: qty 4

## 2014-04-13 MED ORDER — LOPERAMIDE HCL 2 MG PO CAPS
2.0000 mg | ORAL_CAPSULE | ORAL | Status: DC | PRN
  Filled 2014-04-13: qty 1

## 2014-04-13 MED ORDER — NICOTINE 21 MG/24HR TD PT24
21.0000 mg | MEDICATED_PATCH | Freq: Every day | TRANSDERMAL | Status: DC
  Administered 2014-04-13 – 2014-04-14 (×2): 21 mg via TRANSDERMAL
  Filled 2014-04-13 (×2): qty 1

## 2014-04-13 MED ORDER — THIAMINE HCL 100 MG/ML IJ SOLN: 100.0000 mg | Freq: Every day | INTRAMUSCULAR | Status: DC

## 2014-04-13 NOTE — ED Notes (Signed)
Pt reports to ED for ETOH detox, states she drinks all day every day, also detox from crack cocaine, pt states she also smokes marijuana and cigarrettes. Pt states she also takes ibuprofen everyday. Pt states she drank a 40 oz bottle of beer and 20 oz crack cocaine today around 1400.
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
## 2014-04-13 NOTE — ED Provider Notes (Signed)
CSN: [id number]     Arrival date & time 04/13/14  1432 History   First MD Initiated Contact with Patient 04/13/14 1519     No chief complaint on file.    (Consider location/radiation/quality/duration/timing/severity/associated sxs/prior Treatment) HPI Brittany Sanford is a 54 y.o. female who presents to ED requesting detox from alcohol, cocaine. States has been using for more than 20years but frequency and use worsened after the death of her mother 6 months ago. States drinks daily, all day long. Reports shakiness and abdominal pain if does not drink. States uses crack cocaine daily, last used at 2pm. Also uses marijuana. Pt currently complaining of a cough and vaginal discharge. Has been using mucinex with some relief in her cough. No other complaints. No Si or HI.    Past Medical History  Diagnosis Date  . Bronchitis   . Bipolar 1 disorder    History reviewed. No pertinent past surgical history. History reviewed. No pertinent family history. History  Substance Use Topics  . Smoking status: Current Every Day Smoker  . Smokeless tobacco: Not on file  . Alcohol Use: Yes   OB History    No data available     Review of Systems  Constitutional: Negative for fever and chills.  Respiratory: Positive for chest tightness, shortness of breath and wheezing. Negative for cough.   Cardiovascular: Negative for chest pain, palpitations and leg swelling.  Gastrointestinal: Negative for nausea, vomiting, abdominal pain and diarrhea.  Genitourinary: Positive for vaginal discharge. Negative for dysuria, flank pain, vaginal bleeding, vaginal pain and pelvic pain.  Musculoskeletal: Negative for myalgias, arthralgias, neck pain and neck stiffness.  Skin: Negative for rash.  Neurological: Negative for dizziness, weakness and headaches.  All other systems reviewed and are negative.     Allergies  Darvocet  Home Medications   Prior to Admission medications   Medication Sig Start Date End Date  Taking? Authorizing Provider  Brompheniramine-Phenylephrine (COLD & ALLERGY PO) Take 1 tablet by mouth 3 (three) times daily.   Yes Historical Provider, MD  dextromethorphan-guaiFENesin (MUCINEX DM) 30-600 MG per 12 hr tablet Take 1 tablet by mouth 2 (two) times daily as needed for cough.    Yes Historical Provider, MD  ibuprofen (ADVIL,MOTRIN) 600 MG tablet Take 1 tablet (600 mg total) by mouth every 6 (six) hours as needed for pain. Patient not taking: Reported on 04/13/2014 11/16/12   Kristen N Ward, DO  ondansetron (ZOFRAN) 4 MG tablet Take 1 tablet (4 mg total) by mouth every 6 (six) hours. Patient not taking: Reported on 04/13/2014 11/16/12   Layla Maw Ward, DO  oxyCODONE-acetaminophen (PERCOCET/ROXICET) 5-325 MG per tablet Take 1 tablet by mouth every 4 (four) hours as needed for pain. Patient not taking: Reported on 04/13/2014 11/16/12   Layla Maw Ward, DO  traMADol (ULTRAM) 50 MG tablet Take 1 tablet (50 mg total) by mouth every 6 (six) hours as needed for pain. Patient not taking: Reported on 04/13/2014 11/24/12   Melvenia Beam A Upstill, PA-C   BP 115/70 mmHg  Pulse 95  Temp(Src) 97.8 F (36.6 C) (Oral)  Resp 16  SpO2 93% Physical Exam  Constitutional: She is oriented to person, place, and time. She appears well-developed and well-nourished. No distress.  HENT:  Head: Normocephalic.  Eyes: Conjunctivae are normal.  Neck: Neck supple.  Cardiovascular: Normal rate, regular rhythm and normal heart sounds.   Pulmonary/Chest: Effort normal. No respiratory distress. She has wheezes. She has no rales.  Diffuse expiratory wheezes bilaterally  Abdominal:  Soft. Bowel sounds are normal. She exhibits no distension. There is no tenderness. There is no rebound.  Genitourinary:  Normal external genitalia. Normal vaginal canal. Small thin white discharge. Cervix is normal, closed. No CMT. No uterine or adnexal tenderness. No masses palpated.    Musculoskeletal: She exhibits no edema.  Neurological: She is alert  and oriented to person, place, and time.  Skin: Skin is warm and dry.  Psychiatric: She has a normal mood and affect. Her behavior is normal.  Nursing note and vitals reviewed.   ED Course  Procedures (including critical care time) Labs Review Labs Reviewed  WET PREP, GENITAL - Abnormal; Notable for the following:    Trich, Wet Prep MODERATE (*)    Clue Cells Wet Prep HPF POC FEW (*)    WBC, Wet Prep HPF POC RARE (*)    All other components within normal limits  COMPREHENSIVE METABOLIC PANEL - Abnormal; Notable for the following:    Glucose, Bld 114 (*)    All other components within normal limits  ETHANOL - Abnormal; Notable for the following:    Alcohol, Ethyl (B) 69 (*)    All other components within normal limits  URINE RAPID DRUG SCREEN (HOSP PERFORMED) - Abnormal; Notable for the following:    Cocaine POSITIVE (*)    Tetrahydrocannabinol POSITIVE (*)    All other components within normal limits  GC/CHLAMYDIA PROBE AMP  CBC WITH DIFFERENTIAL  URINALYSIS, ROUTINE W REFLEX MICROSCOPIC  RPR  HIV ANTIBODY (ROUTINE TESTING)  POC URINE PREG, ED    Imaging Review No results found.   EKG Interpretation None      MDM   Final diagnoses:  Alcohol abuse  Polysubstance abuse  Polysubstance dependence  Trichomonal vaginitis   Patient here requesting detox from alcohol and cocaine. She is nontoxic appearing. No signs of withdrawal at this time. CIWA and clonidine protocol started. TTS consulted. Patient requesting pelvic exam, states having some vaginal discharge. We will perform  6:45 PM Pt's wet prep positive for trichomonus. Covered with rocephin  IM and zithromax 1g po for possible cervicitis. Will hold off on flagyl due to intoxication.   PT medically cleared. Here voluntarily. No SI or HI.  Needs flagyl upon discharge for Trichomonas  Lottie Mussel, PA-C 04/14/14 2208  Toy Baker, MD 04/16/14 1321

## 2014-04-13 NOTE — BH Assessment (Signed)
Initiated referrals to [facility] and [facility]. Per Elyn Aquas has no beds tonight but may in AM. Per Molly Maduro at [facility] they have beds. Sent referrals to both facilities.    Clista Bernhardt, [facility] Triage Specialist 04/13/2014 10:17 PM

## 2014-04-13 NOTE — ED Notes (Signed)
Bed: WA09 Expected date:  Expected time:  Means of arrival:  Comments: Hold for pelvic exam
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
## 2014-04-14 ENCOUNTER — Encounter (HOSPITAL_COMMUNITY): Payer: Self-pay | Admitting: *Deleted

## 2014-04-14 ENCOUNTER — Inpatient Hospital Stay (HOSPITAL_COMMUNITY)
Admission: AD | Admit: 2014-04-14 | Discharge: 2014-04-19 | DRG: 897 | Disposition: A | Discharge status: Home / Self Care | Payer: Medicaid Other | Source: Intra-hospital | Attending: Psychiatry | Admitting: Psychiatry

## 2014-04-14 DIAGNOSIS — Y903 Blood alcohol level of 60-79 mg/100 ml: Secondary | ICD-10-CM | POA: Diagnosis present

## 2014-04-14 DIAGNOSIS — F1290 Cannabis use, unspecified, uncomplicated: Secondary | ICD-10-CM

## 2014-04-14 DIAGNOSIS — F10239 Alcohol dependence with withdrawal, unspecified: Principal | ICD-10-CM | POA: Diagnosis present

## 2014-04-14 DIAGNOSIS — F319 Bipolar disorder, unspecified: Secondary | ICD-10-CM | POA: Diagnosis present

## 2014-04-14 DIAGNOSIS — Z23 Encounter for immunization: Secondary | ICD-10-CM | POA: Diagnosis not present

## 2014-04-14 DIAGNOSIS — F1210 Cannabis abuse, uncomplicated: Secondary | ICD-10-CM | POA: Diagnosis present

## 2014-04-14 DIAGNOSIS — Z8659 Personal history of other mental and behavioral disorders: Secondary | ICD-10-CM

## 2014-04-14 DIAGNOSIS — F1010 Alcohol abuse, uncomplicated: Secondary | ICD-10-CM | POA: Insufficient documentation

## 2014-04-14 DIAGNOSIS — F4310 Post-traumatic stress disorder, unspecified: Secondary | ICD-10-CM | POA: Diagnosis present

## 2014-04-14 DIAGNOSIS — F17200 Nicotine dependence, unspecified, uncomplicated: Secondary | ICD-10-CM | POA: Diagnosis present

## 2014-04-14 DIAGNOSIS — Z62810 Personal history of physical and sexual abuse in childhood: Secondary | ICD-10-CM | POA: Diagnosis present

## 2014-04-14 DIAGNOSIS — F1410 Cocaine abuse, uncomplicated: Secondary | ICD-10-CM | POA: Diagnosis present

## 2014-04-14 DIAGNOSIS — F1490 Cocaine use, unspecified, uncomplicated: Secondary | ICD-10-CM

## 2014-04-14 DIAGNOSIS — G4700 Insomnia, unspecified: Secondary | ICD-10-CM | POA: Diagnosis present

## 2014-04-14 DIAGNOSIS — F1994 Other psychoactive substance use, unspecified with psychoactive substance-induced mood disorder: Secondary | ICD-10-CM | POA: Diagnosis present

## 2014-04-14 DIAGNOSIS — F1020 Alcohol dependence, uncomplicated: Secondary | ICD-10-CM | POA: Diagnosis present

## 2014-04-14 DIAGNOSIS — F332 Major depressive disorder, recurrent severe without psychotic features: Secondary | ICD-10-CM | POA: Diagnosis present

## 2014-04-14 DIAGNOSIS — F1910 Other psychoactive substance abuse, uncomplicated: Secondary | ICD-10-CM | POA: Diagnosis present

## 2014-04-14 DIAGNOSIS — F1099 Alcohol use, unspecified with unspecified alcohol-induced disorder: Secondary | ICD-10-CM

## 2014-04-14 DIAGNOSIS — J45909 Unspecified asthma, uncomplicated: Secondary | ICD-10-CM | POA: Diagnosis present

## 2014-04-14 LAB — RPR

## 2014-04-14 LAB — HIV ANTIBODY (ROUTINE TESTING W REFLEX): HIV 1&2 Ab, 4th Generation: NONREACTIVE

## 2014-04-14 MED ORDER — MAGNESIUM HYDROXIDE 400 MG/5ML PO SUSP: 30.0000 mL | Freq: Every day | ORAL | Status: DC | PRN

## 2014-04-14 MED ORDER — THIAMINE HCL 100 MG/ML IJ SOLN
100.0000 mg | Freq: Once | INTRAMUSCULAR | Status: AC
  Administered 2014-04-14: 100 mg via INTRAMUSCULAR
  Filled 2014-04-14: qty 2

## 2014-04-14 MED ORDER — ONDANSETRON 4 MG PO TBDP: 4.0000 mg | ORAL_TABLET | Freq: Four times a day (QID) | ORAL | Status: AC | PRN

## 2014-04-14 MED ORDER — ADULT MULTIVITAMIN W/MINERALS CH
1.0000 | ORAL_TABLET | Freq: Every day | ORAL | Status: DC
  Administered 2014-04-15 – 2014-04-19 (×5): 1 via ORAL
  Filled 2014-04-14 (×7): qty 1

## 2014-04-14 MED ORDER — NAPROXEN 500 MG PO TABS
500.0000 mg | ORAL_TABLET | Freq: Two times a day (BID) | ORAL | Status: DC | PRN
  Administered 2014-04-14 – 2014-04-19 (×6): 500 mg via ORAL
  Filled 2014-04-14 (×3): qty 1
  Filled 2014-04-14: qty 6
  Filled 2014-04-14 (×3): qty 1

## 2014-04-14 MED ORDER — METRONIDAZOLE 500 MG PO TABS: 2000.0000 mg | ORAL_TABLET | Freq: Once | ORAL | Status: DC

## 2014-04-14 MED ORDER — LOPERAMIDE HCL 2 MG PO CAPS: 2.0000 mg | ORAL_CAPSULE | ORAL | Status: AC | PRN

## 2014-04-14 MED ORDER — TRAZODONE HCL 50 MG PO TABS
50.0000 mg | ORAL_TABLET | Freq: Every evening | ORAL | Status: DC | PRN
  Administered 2014-04-14: 50 mg via ORAL
  Filled 2014-04-14 (×4): qty 1

## 2014-04-14 MED ORDER — VITAMIN B-1 100 MG PO TABS
100.0000 mg | ORAL_TABLET | Freq: Every day | ORAL | Status: DC
  Administered 2014-04-15 – 2014-04-19 (×5): 100 mg via ORAL
  Filled 2014-04-14 (×7): qty 1

## 2014-04-14 MED ORDER — INFLUENZA VAC SPLIT QUAD 0.5 ML IM SUSY
0.5000 mL | PREFILLED_SYRINGE | INTRAMUSCULAR | Status: AC
  Administered 2014-04-15: 0.5 mL via INTRAMUSCULAR
  Filled 2014-04-14: qty 0.5

## 2014-04-14 MED ORDER — CHLORDIAZEPOXIDE HCL 25 MG PO CAPS
25.0000 mg | ORAL_CAPSULE | Freq: Every day | ORAL | Status: AC
  Administered 2014-04-18: 25 mg via ORAL
  Filled 2014-04-14: qty 1

## 2014-04-14 MED ORDER — HYDROXYZINE HCL 25 MG PO TABS
25.0000 mg | ORAL_TABLET | Freq: Four times a day (QID) | ORAL | Status: AC | PRN
  Administered 2014-04-15 – 2014-04-16 (×2): 25 mg via ORAL
  Filled 2014-04-14 (×2): qty 1

## 2014-04-14 MED ORDER — PNEUMOCOCCAL VAC POLYVALENT 25 MCG/0.5ML IJ INJ
0.5000 mL | INJECTION | INTRAMUSCULAR | Status: AC
  Administered 2014-04-15: 0.5 mL via INTRAMUSCULAR

## 2014-04-14 MED ORDER — TRAZODONE HCL 100 MG PO TABS: 100.0000 mg | ORAL_TABLET | Freq: Every evening | ORAL | Status: DC | PRN

## 2014-04-14 MED ORDER — ALBUTEROL SULFATE HFA 108 (90 BASE) MCG/ACT IN AERS
1.0000 | INHALATION_SPRAY | Freq: Four times a day (QID) | RESPIRATORY_TRACT | Status: DC | PRN
  Administered 2014-04-14 – 2014-04-19 (×4): 2 via RESPIRATORY_TRACT
  Filled 2014-04-14: qty 6.7

## 2014-04-14 MED ORDER — CHLORDIAZEPOXIDE HCL 25 MG PO CAPS
25.0000 mg | ORAL_CAPSULE | Freq: Four times a day (QID) | ORAL | Status: AC | PRN
  Administered 2014-04-16: 25 mg via ORAL
  Filled 2014-04-14: qty 1

## 2014-04-14 MED ORDER — CHLORDIAZEPOXIDE HCL 25 MG PO CAPS
25.0000 mg | ORAL_CAPSULE | ORAL | Status: AC
  Administered 2014-04-17 (×2): 25 mg via ORAL
  Filled 2014-04-14 (×2): qty 1

## 2014-04-14 MED ORDER — CHLORDIAZEPOXIDE HCL 25 MG PO CAPS
25.0000 mg | ORAL_CAPSULE | Freq: Three times a day (TID) | ORAL | Status: AC
  Administered 2014-04-16 (×3): 25 mg via ORAL
  Filled 2014-04-14 (×3): qty 1

## 2014-04-14 MED ORDER — CHLORDIAZEPOXIDE HCL 25 MG PO CAPS
25.0000 mg | ORAL_CAPSULE | Freq: Four times a day (QID) | ORAL | Status: AC
  Administered 2014-04-14 – 2014-04-15 (×5): 25 mg via ORAL
  Filled 2014-04-14 (×5): qty 1

## 2014-04-14 MED ORDER — ALUM & MAG HYDROXIDE-SIMETH 200-200-20 MG/5ML PO SUSP
30.0000 mL | ORAL | Status: DC | PRN
  Administered 2014-04-18 – 2014-04-19 (×2): 30 mL via ORAL
  Filled 2014-04-14 (×2): qty 30

## 2014-04-14 MED ORDER — CARBAMAZEPINE 200 MG PO TABS
200.0000 mg | ORAL_TABLET | Freq: Two times a day (BID) | ORAL | Status: DC
  Administered 2014-04-14: 200 mg via ORAL
  Filled 2014-04-14 (×2): qty 1

## 2014-04-14 NOTE — Tx Team (Signed)
Initial Interdisciplinary Treatment Plan   PATIENT STRESSORS: Financial difficulties Legal issue Substance abuse   PATIENT STRENGTHS: Active sense of humor Capable of independent living General fund of knowledge Supportive family/friends Work skills   PROBLEM LIST: Problem List/Patient Goals Date to be addressed Date deferred Reason deferred Estimated date of resolution  "I'm sick don't want to drink, use crack anymore" 04-14-14     "hear people calling my name"  04-14-14                                                DISCHARGE CRITERIA:  Improved stabilization in mood, thinking, and/or behavior Motivation to continue treatment in a less acute level of care Verbal commitment to aftercare and medication compliance Withdrawal symptoms are absent or subacute and managed without 24-hour nursing intervention  PRELIMINARY DISCHARGE PLAN: Attend PHP/IOP Outpatient therapy Participate in family therapy Return to previous living arrangement Return to previous work or school arrangements  PATIENT/FAMIILY INVOLVEMENT: This treatment plan has been presented to and reviewed with the patient, Brittany Sanford, and/or family member.  The patient and family have been given the opportunity to ask questions and make suggestions.  Mickeal NeedyJohnson, Carlyon Nolasco N 04/14/2014, 11:49 PM

## 2014-04-14 NOTE — BH Assessment (Addendum)
[facility] Assessment Progress Note  [facility] by Thedore MinsMojeed Akintayo, MD.  Per Berneice Heinrichina Tate, RN, [facility], a bed has not yet become available but will open later today.  She advises me to have pt sign paperwork.  Pt has signed Voluntary Admission and Consent for Treatment, as well as Consent to Release Information, and [facility].  Pt's nurse, Minerva Areolaric, has been notified.  When the time comes, he agrees to send [facility] along with the pt via [facility], and to call report to [phone].  Doylene Canninghomas Matraca Hunkins, MA Triage Specialist 04/14/2014 @ 15:58  Addendum:  Pt has been assigned to Rm 403-2, but Inetta Fermoina requests that she not be transferred until around 21:00.  Pt's nurse, Minerva AreolaEric, has been informed.  Doylene Canninghomas [place], MA Triage Specialist 04/14/2014 @ 16:16

## 2014-04-14 NOTE — ED Notes (Signed)
Pt transported to [facility] by [facility] transportation service for continuation of specialized care. She left in no acute distress. 

## 2014-04-14 NOTE — Progress Notes (Signed)
Patient ID: Brittany MeierLesa G Kidd, female   DOB: [birth year], 54 y.o.   MRN: [id number] Client is 54 yo female admitted for substance abuse, "I'm sick don't want to drink and use crack anymore, promise my mom on her death bed" "I get sick if I don't have it" Client reports she has been drinking since she was 19 and using drugs since she was 22. Client last treatment was at [facility] "6-7 years ago" client reports "I hear people calling my name, can't make it out it's like Misty StanleyLisa, just started when I got here" client and sister lives together. Client reports a medical history of scoliosis, also complains of urinary incontinence. Client also have pending court date next month due to DWI and paraphernalia position.

## 2014-04-14 NOTE — Progress Notes (Signed)
  CARE MANAGEMENT ED NOTE 04/14/2014  Patient:  Brittany Sanford,Brittany Sanford   Account Number:  [id number]  Date Initiated:  04/14/2014  Documentation initiated by:  Edd ArbourGIBBS,KIMBERLY  Subjective/Objective Assessment:   [age] yr old self pay [place] pt requesting detox from alcohol, cocaine     Subjective/Objective Assessment Detail:   no pcp as confirmed by pt  Pt needing =1 assistance to bathroom  dx Alcohol Use Disorder, Severe            Cocaine Disorder, Severe            Cannabis Use Disorder, Mild    Referral to [facility]     Action/Plan:   ED CM spoke with pt see notes below   Action/Plan Detail:   Anticipated DC Date:       Status Recommendation to Physician:   Result of Recommendation:    Other ED Services  Consult Working Plan    DC [employer]  Other  Outpatient Services - Pt will follow up  PCP issues    Choice offered to / List presented to:            Status of service:  Completed, signed off  ED Comments:   ED Comments Detail:  CM spoke with pt who confirms self pay [place] resident with no pcp. CM discussed and provided written information for self pay pcps, importance of pcp for f/u care, www.needymeds.org, www.goodrx.com, discounted pharmacies and other [place] resources Pt voiced understanding and appreciation of resources provided

## 2014-04-14 NOTE — ED Notes (Signed)
MD at bedside. TTS PRESENT TO EVALUATE THIS PT 

## 2014-04-14 NOTE — BH Assessment (Signed)
Patsy at [facility] reports they will call back about 8:30 am to see how pt is doing to determine if [facility] can accommodate pt's needs. She is being considered for placement.   Clista BernhardtNancy Soul Hackman, [facility] Triage Specialist 04/14/2014 5:44 AM
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
## 2014-04-14 NOTE — BH Assessment (Addendum)
Per Patsy at [facility] pt is being considered for acceptance.   Brittany BernhardtNancy Faythe Heitzenrater, [facility] Triage Specialist 04/14/2014 12:43 AM
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
## 2014-04-14 NOTE — ED Notes (Signed)
Patient ambulated in the hallway. Patient was jittery, but for the most part she was steady. Patient repeated several times, "I just need some alcohol."

## 2014-04-15 ENCOUNTER — Encounter (HOSPITAL_COMMUNITY): Payer: Self-pay | Admitting: Registered Nurse

## 2014-04-15 DIAGNOSIS — F1994 Other psychoactive substance use, unspecified with psychoactive substance-induced mood disorder: Secondary | ICD-10-CM

## 2014-04-15 DIAGNOSIS — Z8659 Personal history of other mental and behavioral disorders: Secondary | ICD-10-CM

## 2014-04-15 DIAGNOSIS — F1290 Cannabis use, unspecified, uncomplicated: Secondary | ICD-10-CM

## 2014-04-15 DIAGNOSIS — F3130 Bipolar disorder, current episode depressed, mild or moderate severity, unspecified: Secondary | ICD-10-CM

## 2014-04-15 DIAGNOSIS — F1190 Opioid use, unspecified, uncomplicated: Secondary | ICD-10-CM

## 2014-04-15 DIAGNOSIS — F1010 Alcohol abuse, uncomplicated: Secondary | ICD-10-CM

## 2014-04-15 DIAGNOSIS — F4310 Post-traumatic stress disorder, unspecified: Secondary | ICD-10-CM

## 2014-04-15 LAB — GC/CHLAMYDIA PROBE AMP
CT PROBE, AMP APTIMA: NEGATIVE
GC PROBE AMP APTIMA: NEGATIVE

## 2014-04-15 MED ORDER — METRONIDAZOLE 500 MG PO TABS
2000.0000 mg | ORAL_TABLET | ORAL | Status: AC
  Administered 2014-04-15: 2000 mg via ORAL
  Filled 2014-04-15: qty 4

## 2014-04-15 MED ORDER — NICOTINE 21 MG/24HR TD PT24
21.0000 mg | MEDICATED_PATCH | Freq: Every day | TRANSDERMAL | Status: DC
Start: 2014-04-15 — End: 2014-04-19
  Administered 2014-04-15 – 2014-04-19 (×5): 21 mg via TRANSDERMAL
  Filled 2014-04-15 (×6): qty 1

## 2014-04-15 MED ORDER — RISPERIDONE 1 MG PO TABS
1.0000 mg | ORAL_TABLET | Freq: Every day | ORAL | Status: DC
  Administered 2014-04-15 – 2014-04-18 (×4): 1 mg via ORAL
  Filled 2014-04-15 (×5): qty 1

## 2014-04-15 MED ORDER — MIRTAZAPINE 15 MG PO TABS
15.0000 mg | ORAL_TABLET | Freq: Every day | ORAL | Status: DC
  Administered 2014-04-15 – 2014-04-18 (×4): 15 mg via ORAL
  Filled 2014-04-15 (×5): qty 1
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
## 2014-04-15 NOTE — [facility] Group Notes (Signed)
Adult Psychoeducational Group Note  Date:  04/15/2014 Time:  9:50 PM  Group Topic/Focus:  NA Meeting  Participation Level:  Minimal  Participation Quality:  Attentive  Affect:  Flat  Cognitive:  Alert  Insight: Limited  Engagement in Group:  Limited  Modes of Intervention:  Discussion and Education  Additional Comments:  Vashti attended group.  [name], Umer Harig A 04/15/2014, 9:50 PM

## 2014-04-15 NOTE — [facility] Group Notes (Signed)
[facility] LCSW Group Therapy      Emotional Regulation 1:15 - 2:30 PM            04/15/2014 2:55 PM  Type of Therapy:  Group Therapy  Participation Level:  Patient came to group but left trembling shortly after group started.  Wynn BankerHodnett, Yeily Link Hairston 04/15/2014, 2:55 PM

## 2014-04-15 NOTE — Progress Notes (Signed)
Patient ID: [name], female   DOB: 23-Dec-1960, 54 y.o.   MRN: [id number]  Continuous Observation Note: Pt started on continuous observation by provider. Pt found in bed, trembling at the end of the bed. Pt reported "I cannot stop shaking and I can't breathe." Pt's vitals monitored and no distress noted. Pt instructed to deep breathe and given emotional support. Pt given prn anti-anxiolytic per provider. Pt given fluids. Pt put on continuous monitoring and sat at the nurses station due to lack of staff per [facility]. Pt currently in no distress and sleepy.

## 2014-04-15 NOTE — Progress Notes (Signed)
Patient ID: Brittany MeierLesa G Sanford, female   DOB: 1961/02/20, 54 y.o.   MRN: [id number]  Continuous Observation Note: Pt asks if "I can go back to my room and sleep now. I'm okay." Pt given emotional support and offered fluids. Pt is stable and in no current distress.

## 2014-04-15 NOTE — [facility] Group Notes (Signed)
[facility]   04/15/2014 11:28 AM  Participation Quality:  Patient did not attend group.   Blessin Kanno, Joesph JulyQuylle Hairston
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
## 2014-04-15 NOTE — Progress Notes (Signed)
Patient in bed resting at the beginning of the shift. Mood and affects flat and depressed. Encouraged and assisted to the lab to have her blood drawn. Patient denied having SI/Hi and denied hallucinations. Q 15 minute check continues as ordered to maintain safety.

## 2014-04-16 DIAGNOSIS — F1024 Alcohol dependence with alcohol-induced mood disorder: Secondary | ICD-10-CM

## 2014-04-16 DIAGNOSIS — F329 Major depressive disorder, single episode, unspecified: Secondary | ICD-10-CM

## 2014-04-16 LAB — HEPATITIS C ANTIBODY: HCV AB: NEGATIVE

## 2014-04-16 LAB — HEPATITIS B SURFACE ANTIGEN: HEP B S AG: NEGATIVE

## 2014-04-16 NOTE — Progress Notes (Signed)
D    Pt had a episode of irritability and agitation   Demanding to be discharged  Said her sister was dying of cancer and she wouldn't be alive in the morning  Explained to pt why she would have to wait until tomorrow for the doctor to see her   Pt was very irrational and demanding  Throwing things screaming and became incontinent on herself   [place] SpanielSaid she hoped all staff family members died    She later calmed down after taking a shower and laying on her bed for a while   She came to the nurses station and began apologizeing for her behavior about an hour later and requested her medications that were offered to her while she was out of control A   Verbal support and encouragement given   Allowed pt time to vent and validated her feelings   Medications administered and effectiveness monitored    Q 15 min checks R   Pt safe at present
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
## 2014-04-16 NOTE — Progress Notes (Signed)
Pt attended the AA speaker meeting.  Reni Hausner C, NT 

## 2014-04-16 NOTE — Progress Notes (Signed)
Patient ID: [name], female   DOB: 1960/11/19, 54 y.o.   MRN: [id number]  Pt seen walking to consult room with Dr. Jama Flavorsobos, steadily. Pt also seen sitting on floor, smiling and petting Pet Therapy Dog. Pt says "I feel better today."

## 2014-04-16 NOTE — [facility] Suicide Risk Assessment (Signed)
[facility] INPATIENT:  Family/Significant Other Suicide Prevention Education  Suicide Prevention Education:  Patient Refusal for Family/Significant Other Suicide Prevention Education: The patient Brittany Sanford has refused to provide written consent for family/significant other to be provided Family/Significant Other Suicide Prevention Education during admission and/or prior to discharge.  Physician notified.  Wynn BankerHodnett, Robynn Marcel Hairston 04/16/2014, 1:53 PM

## 2014-04-16 NOTE — Progress Notes (Signed)
[facility] Group Notes:  (Nursing/MHT/Case Management/Adjunct)  Date:  04/16/2014  Time:  0915   Type of Therapy:  Psychoeducational Skills  Participation Level:  Improving  Participation Quality:  Appropriate  Affect:  Flat  Cognitive:  Alert  Insight:  Good  Engagement in Group:  Engaged  Modes of Intervention:  Discussion, Education, Exploration and Support  Summary of Progress/Problems: Pt able to identify current feelings. Pt also identified one leisure activity and one healthy lifestyle change to make once discharged.   Brittany Sanford, Brittany Sanford E 04/16/2014, 10:55 AM

## 2014-04-16 NOTE — Progress Notes (Signed)
Recreation Therapy Notes  Animal-Assisted Activity/Therapy (AAA/T) Program Checklist/Progress Notes Patient Eligibility Criteria Checklist & Daily Group note for Rec Tx Intervention  Date: 01.07.2015 Time: 2:45pm [address]    AAA/T Program Assumption of Risk Form signed by Patient/ or Parent Legal Guardian yes  Patient is free of allergies or sever asthma yes  Patient reports no fear of animals yes  Patient reports no history of cruelty to animals yes  Patient understands his/her participation is voluntary yes  Patient washes hands before animal contact yes  Patient washes hands after animal contact yes  Behavioral Response: Engaged, Appropriate   Education: Charity fundraiserHand Washing, Appropriate Animal Interaction   Education Outcome: Acknowledges education.   Clinical Observations/Feedback: Patient pet therapy dog appropriately from floor level, asked appropriate questions about therapy dog and his training and fed therapy dog a treat in exchange for completing basic obedience commands.    Marykay Lexenise L Mical Kicklighter, LRT/CTRS  Jearl KlinefelterBlanchfield, Deleah Tison L 04/16/2014 4:33 PM

## 2014-04-16 NOTE — Evaluation (Signed)
Physical Therapy Evaluation Patient Details Name: Brittany Sanford MRN: [id number] DOB: 26-Apr-1960 Today's Date: 04/16/2014   History of Present Illness  54 yo female adm to [facility] 04/14/13  d/t multi-substance abuse, PT consulted d/t unsteady gait  Clinical Impression  Will continue to follow for deficits noted below; spoke with RN and left RW for pt to use with staff, recommend pt amb as tol and as staff available to assist; Will follow for D/C needs, and DME needs/changes    Follow Up Recommendations Home health PT;No PT follow up    Equipment Recommendations  Rolling walker with 5" wheels    Recommendations for Other Services       Precautions / Restrictions Precautions Precautions: Fall Precaution Comments: pt with multiple epidsodes of "knee buckling" with I recovery during PT eval      Mobility  Bed Mobility               General bed mobility comments: NT   Transfers Overall transfer level: Needs assistance Equipment used: Rolling walker (2 wheeled);None Transfers: Sit to/from Stand Sit to Stand: Min guard;Supervision         General transfer comment: for safety, cues for hand placement when RW utilized  Ambulation/Gait Ambulation/Gait assistance: Min guard;Min assist Ambulation Distance (Feet): 60 Feet (20') Assistive device: Rolling walker (2 wheeled);None Gait Pattern/deviations: Step-through pattern;Decreased stride length;Shuffle;Trunk flexed;Narrow base of support   Gait velocity interpretation: Below normal speed for age/gender General Gait Details: pt with repeated episodes of knee buckling during gait both with and without RW, she is able to recover of her own volition although min to min guard provided by PT for safety; cues to advance RLE at times as well as for RW position and  safety  Stairs            Wheelchair Mobility    Modified Rankin (Stroke Patients Only)       Balance                                              Pertinent Vitals/Pain Pain Assessment: Faces Faces Pain Scale: Hurts little more Pain Location: R hand Pain Intervention(s): Monitored during session    Home Living Family/patient expects to be discharged to:: Private residence                      Prior Function Level of Independence: Independent               Hand Dominance        Extremity/Trunk Assessment   Upper Extremity Assessment: RUE deficits/detail RUE Deficits / Details: poor grip on R d/t  arthritis per pt         Lower Extremity Assessment: Generalized weakness (per testing, appears better at times during functional obs)         Communication   Communication: No difficulties  Cognition Arousal/Alertness: Awake/alert Behavior During Therapy: Restless Overall Cognitive Status: Within Functional Limits for tasks assessed                      General Comments      Exercises        Assessment/Plan    PT Assessment Patient needs continued PT services  PT Diagnosis Difficulty walking   PT Problem List Decreased activity tolerance;Decreased balance;Decreased mobility;Decreased knowledge of use of DME  PT Treatment Interventions DME instruction;Gait training;Therapeutic activities;Therapeutic exercise;Patient/family education   PT Goals (Current goals can be found in the Care Plan section) Acute Rehab PT Goals Patient Stated Goal: to get better, I'm too young for this PT Goal Formulation: With patient Time For Goal Achievement: 04/23/14 Potential to Achieve Goals: Good    Frequency Min 2X/week   Barriers to discharge        Co-evaluation               End of Session Equipment Utilized During Treatment: Gait belt Activity Tolerance: Patient tolerated treatment well Patient left: Other (comment) (in w/c in hallway) Nurse Communication: Mobility status         Time: [id number] PT Time Calculation (min) (ACUTE ONLY): 17 min   Charges:   PT  Evaluation $Initial PT Evaluation Tier I: 1 Procedure PT Treatments $Gait Training: 8-22 mins   PT G Codes:        Brittany Sanford 04/16/2014, 12:00 PM

## 2014-04-16 NOTE — Progress Notes (Deleted)
Patient ID: Brittany MeierLesa G Sanford, female   DOB: 05-11-1960, 54 y.o.   MRN: [id number]

## 2014-04-16 NOTE — [facility] Group Notes (Signed)
[facility] LCSW Group Therapy  Patient listened attentively to speaker from Mental Health Assoication.   04/16/2014 1:53 PM  Type of Therapy:  Group Therapy  Participation Level:  Did Not Attend   Brittany Sanford, Brittany Sanford 04/16/2014, 1:53 PM

## 2014-04-16 NOTE — Progress Notes (Signed)
Patient ID: Brittany MeierLesa G Sanford, female   DOB: 07/09/1960, 54 y.o.   MRN: [id number]   Pt currently presents with a flat affect and depressed behavior. Pt remains in her room for most of the day today. Pt's goal is to "not drink or do drugs anymore." Pt identifies spending time with her grandchildren sober as a motivation. Pt reports good sleep, better concentration and a fair appetite.   Pt provided with medications per providers orders. Pt's labs and vitals were monitored throughout the day. Pt supported emotionally and encouraged to express concerns and questions. Pt consulted with provider and Clinical research associatewriter. Pt educated on substance abuse withdrawal. Pt consult worked with pt today.   Pt's safety ensured with 15 minute and environmental checks. Pt currently denies SI/HI and A/V hallucinations. Pt verbally agrees to seek staff if SI/HI or A/VH occurs and to consult with staff before acting on these thoughts. Pt given a walker by PT consult and instructed to use walker when walking. Pt should always be accompanied when using walker otherwise wheelchair should be used.

## 2014-04-17 DIAGNOSIS — F10239 Alcohol dependence with withdrawal, unspecified: Principal | ICD-10-CM

## 2014-04-17 NOTE — Progress Notes (Signed)
Adult Psychoeducational Group Note  Date:  04/17/2014 Time:  4:09 PM  Group Topic/Focus:  Goals Group:   The focus of this group is to help patients establish daily goals to achieve during treatment and discuss how the patient can incorporate goal setting into their daily lives to aide in recovery.  Participation Level:  Active  Participation Quality:  Appropriate  Affect:  Appropriate  Cognitive:  Appropriate  Insight: Appropriate  Engagement in Group:  Engaged  Modes of Intervention:  Discussion  Additional Comments:  Pt stated her goal for the day was to look forward to the future, so she can spend time with her grandchildren.  Wynema BirchCagle, Devony Mcgrady D 04/17/2014, 4:09 PM

## 2014-04-17 NOTE — Plan of Care (Signed)
Problem: Alteration in mood & ability to function due to Goal: STG-Patient will attend groups Outcome: Not Progressing Pt did not attend group this evening  Problem: Ineffective individual coping Goal: LTG: Patient will report a decrease in negative feelings Outcome: Not Met (add Reason) Pt continues to make negative statements

## 2014-04-17 NOTE — [facility] Group Notes (Signed)
[facility] LCSW Group Therapy  Feelings Around Relapse 1:15 -2:30        04/17/2014 3:17 PM   Type of Therapy:  Group Therapy  Participation Level:  Appropriate  Participation Quality:  Appropriate  Affect:  Appropriate  Cognitive:  Attentive Appropriate  Insight:  Developing/Improving  Engagement in Therapy: Developing/Improving  Modes of Intervention:  Discussion Exploration Problem-Solving Supportive  Summary of Progress/Problems:  The topic for today was feelings around relapse.  Patient processed feelings toward relapse and was able to relate to peers. Patient sahred relapsing for her would be pushing others away.  She shared once everyone is gone, she gets on the phone to her drug using friends. Patient identified coping skills that can be used to prevent a relapse as getting a new phone and getting rid of old phone numbers.Wynn Banker.   Lyncoln Ledgerwood Hairston 04/17/2014 3:17 PM

## 2014-04-17 NOTE — [facility] Group Notes (Signed)
Adult Psychoeducational Group Note  Date:  04/17/2014 Time:  11:45 PM  Group Topic/Focus:  AA Meeting  Participation Level:  Active  Participation Quality:  Appropriate  Affect:  Appropriate  Cognitive:  Appropriate  Insight: Appropriate  Engagement in Group:  Engaged  Modes of Intervention:  Discussion and Education  Additional Comments:  Brittany Sanford attended group.  [name], [name] A 04/17/2014, 11:45 PM

## 2014-04-17 NOTE — Progress Notes (Signed)
Patient did not attend the evening karaoke group. Pt was on unit upset about not being able to leave reporting her sister was dying of cancer. Pt remained on unit, showered, and calmed down.

## 2014-04-17 NOTE — Progress Notes (Signed)
Patient ID: Brittany Sanford, female   DOB: 1960/06/05, 54 y.o.   MRN: [id number] [facility] MD Progress Note  04/17/2014 3:07 PM Brittany Sanford  MRN:  [id number] Subjective: Patient reports she is feeling better. She states last night she was very upset because someone called the unit saying that her sister was dying. She states " I lost it for a little bit, but then I called a friend and she told me my sister is fine". I think it was someone trying to get me to leave the unit and relapse.  Objective:  I have discussed case with treatment team and have met with patient. Gait much improved and currently walking without assistance and with no difficulty. No significant diaphoresis or tremors at this time. Mood improved , although still episodes of emotional lability/irritability. Visible in day room, going to groups. Tolerating standing Librium alcohol detox.protocol well with no side effects at this time. 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 Has had No seizures on unit  Patient is becoming more future oriented and looking forward to discharge soon. 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 rate 20, height 4' 10.25" (1.48 m), weight 90 lb (40.824 kg).Body mass index is 18.64 kg/(m^2).  General Appearance: improved  Eye Contact::  Good  Speech:  Normal Rate  Volume:  Normal  Mood:  improved, and affect fuller in range   Affect:  more reactive   Thought Process:  Goal Directed and Linear  Orientation:  Other:  fully alert and attentive  Thought Content:  denies any hallucinations at this time and does not appear internally preoccupied   Suicidal Thoughts:  No at this time denies any thoughts of hurting self and  contracts for safety on unit   Homicidal Thoughts:  No  Memory:  Recent and Remote grossly intact  Judgement:  Fair  Insight:  Fair  Psychomotor Activity:  Normal  Concentration:  Good  Recall:  Good  Fund of Knowledge:Good  Language: Good  Akathisia:  Negative  Handed:  Right  AIMS (if indicated):     Assets:  Desire for Improvement Resilience  Sleep:  Number of Hours: 6   Musculoskeletal: Strength & Muscle Tone: within normal limits Gait & Station: becoming steadier Patient leans: N/A  Current Medications: Current Facility-Administered Medications  Medication Dose Route Frequency Provider Last Rate Last Dose  . albuterol (PROVENTIL HFA;VENTOLIN HFA) 108 (90 BASE) MCG/ACT inhaler 1-2 puff  1-2 puff Inhalation Q6H PRN Laverle Hobby, PA-C   2 puff at 04/15/14 1040  . alum & mag hydroxide-simeth (MAALOX/MYLANTA) 200-200-20 MG/5ML suspension 30 mL  30 mL Oral Q4H PRN Laverle Hobby, PA-C      . chlordiazePOXIDE (LIBRIUM) capsule 25 mg  25 mg Oral Q6H PRN Laverle Hobby, PA-C   25 mg at 04/16/14 2131  . chlordiazePOXIDE (LIBRIUM) capsule 25 mg  25 mg Oral  BH-qamhs Laverle Hobby, PA-C   25 mg at 04/17/14 6384   Followed by  . [START ON 04/18/2014] chlordiazePOXIDE (LIBRIUM) capsule 25 mg  25 mg Oral Daily Laverle Hobby, PA-C      . hydrOXYzine (ATARAX/VISTARIL) tablet 25 mg  25 mg Oral Q6H PRN Laverle Hobby, PA-C   25 mg at 04/16/14 2131  . loperamide (IMODIUM) capsule 2-4 mg  2-4 mg Oral PRN Laverle Hobby, PA-C      . magnesium hydroxide (MILK OF MAGNESIA) suspension 30 mL  30 mL Oral Daily PRN Laverle Hobby, PA-C      . mirtazapine (REMERON) tablet 15 mg  15 mg Oral QHS Shuvon Rankin, NP   15 mg at  04/16/14 2131  . multivitamin with minerals tablet 1 tablet  1 tablet Oral Daily Laverle Hobby, PA-C   1 tablet at 04/17/14 5364  . naproxen (NAPROSYN) tablet 500 mg  500 mg Oral BID PRN Laverle Hobby, PA-C   500 mg at 04/14/14 2300  . nicotine (NICODERM CQ - dosed in mg/24 hours) patch 21 mg  21 mg Transdermal Daily Nicholaus Bloom, MD   21 mg at 04/17/14 0855  . ondansetron (ZOFRAN-ODT) disintegrating tablet 4 mg  4 mg Oral Q6H PRN Laverle Hobby, PA-C      . risperiDONE (RISPERDAL) tablet 1 mg  1 mg Oral QHS Shuvon Rankin, NP   1 mg at 04/16/14 2131  . thiamine (VITAMIN B-1) tablet 100 mg  100 mg Oral Daily Laverle Hobby, PA-C   100 mg at 04/17/14 6803    Lab Results:  Results for orders placed or performed during the hospital encounter of 04/14/14 (from the past 48 hour(s))  Hepatitis B surface antigen     Status: None   Collection Time: 04/15/14  7:44 PM  Result Value Ref Range   Hepatitis B Surface Ag NEGATIVE NEGATIVE    Comment: Performed at [facility]  Hepatitis C antibody     Status: None   Collection Time: 04/15/14  7:44 PM  Result Value Ref Range   HCV Ab NEGATIVE NEGATIVE    Comment: Performed at [facility]    Physical Findings: AIMS: Facial and Oral Movements Muscles of Facial Expression: None, normal Lips and Perioral Area: None, normal Jaw: None, normal Tongue: None, normal,Extremity Movements Upper (arms, wrists, hands, fingers): None, normal Lower (legs, knees, ankles, toes): None, normal, Trunk Movements Neck, shoulders, hips: None, normal, Overall Severity Severity of abnormal movements (highest score from questions above): None, normal Incapacitation due to abnormal movements: None, normal Patient's awareness of abnormal movements (rate only patient's report): No Awareness, Dental Status Current problems with teeth and/or dentures?: No Does patient usually wear dentures?: No  CIWA:  CIWA-Ar Total: 7 COWS:  COWS Total Score: 4    Assessment:  Patient continues to improve and at present is not presenting with significant withdrawal symptoms. She is completing Librium taper. She is tolerating Remeron well . She does continue to have some periods of increased lability, but at present is calm, with behavior in good control. Appetites has improved, is starting to gain weight, and ambulation /gait is much improved at present . Treatment Plan Summary: Daily contact with patient to assess and evaluate symptoms and progress in treatment Medication management See below  Plan: 1.Continue inpatient treatment , milieu, support 2. Continue Libirum detox protocol 3. Consider discharge soon as she continues to improve/completes detox protocol 4. Continue Remeron 15  mgrs QHS 5. Continue Risperidone 1 mgr QHS  Medical Decision Making Problem Points:  Established problem, stable/improving (1), Review of last therapy session (1) and Review of psycho-social stressors (1) Data Points:  Review or order clinical lab tests (1) Review of medication regiment & side effects (2)  I certify that inpatient services furnished can reasonably be expected to improve the patient's condition.   COBOS, FERNANDO 04/17/2014, 3:07 PM
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
## 2014-04-17 NOTE — [facility] Group Notes (Signed)
[facility]   04/17/2014 10:13 AM    Participation Quality:  Appropraite  Mood/Affect:  Appropriate  Depression Rating:  1  Anxiety Rating:  2  Thoughts of Suicide:  No  Will you contract for safety?   NA  Current AVH:  No  Plan for Discharge/Comments:  Patient attended discharge planning group and actively participated in group. She advised of not being interested in residential treatment but [facility].Suicide prevention education reviewed and SPE document provided.   Transportation Means: Patient has transportation.   Supports:  Patient has a support system.   Meade Hogeland, Joesph JulyQuylle Hairston

## 2014-04-17 NOTE — Progress Notes (Signed)
D: Patient is A&Ox4, denies SI, HI, A/V hallucinations. Patient is pleasant and appropriate on approach. Patient stated she talked to her friend and he stated that her sister is not dying. 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 Patient states she is now willing to stay for treatment.  A: Patient given verbal and emotional support prn. Medications given as scheduled and prn. Continue q15 minute safety checks.   R: Patient remains safe. Patient verbalized willingness to find staff if she needs anything or can no longer contract for safety.  Eara Burruel, Wyman SongsterAngela Marie

## 2014-04-18 NOTE — Progress Notes (Signed)
Patient ID: Brittany Sanford, female   DOB: February 09, 1961, 54 y.o.   MRN: [id number] [facility] MD Progress Note  04/18/2014 3:15 PM FARAH LEPAK  MRN:  [id number] Subjective: Patient reports she is feeling better. Pt denies withdrawal symptoms.  Objective:  I have met with patient, and chart reviewed. Gait much improved and currently walking without assistance and with no difficulty. No significant diaphoresis or tremors at this time. Mood improved. Visible in day room, going to groups. Tolerating standing Librium alcohol detox.protocol well with no side effects at this time. 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 Has had No seizures on unit.  Patient is becoming more future oriented and looking forward to discharge soon. 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   Diagnosis:   Alcohol Dependence and Alcohol Induced Mood Disorder Depressed, versus MDD  Total Time spent with patient: 25 minutes     ADL's: improved   Sleep: improved   Appetite:  Good   Suicidal Ideation:  Denies  Homicidal Ideation:  Denies  AEB (as evidenced by):  Psychiatric Specialty Exam: Physical Exam  ROS  Blood pressure 121/74, pulse 95, temperature 97.8 F (36.6 C), temperature source Oral, resp. rate 17, height 4' 10.25" (1.48 m), weight 40.824 kg (90 lb), SpO2 98 %.Body mass index is 18.64 kg/(m^2).  General Appearance: improved  Eye Contact::  Good  Speech:  Normal Rate  Volume:  Normal  Mood:  improved, and affect fuller in range   Affect:  more reactive  Thought Process:  Goal Directed and Linear  Orientation:  Other:  fully alert and attentive  Thought Content:  denies any hallucinations at this time and does not appear internally preoccupied   Suicidal Thoughts:  No at this time denies any thoughts of hurting self and  contracts for safety on unit   Homicidal Thoughts:   No  Memory:  Recent and Remote grossly intact  Judgement:  Fair  Insight:  Fair  Psychomotor Activity:  Normal  Concentration:  Good  Recall:  Good  Fund of Knowledge:Good  Language: Good  Akathisia:  Negative  Handed:  Right  AIMS (if indicated):     Assets:  Desire for Improvement Resilience  Sleep:  Number of Hours: 5.75   Musculoskeletal: Strength & Muscle Tone: within normal limits Gait & Station: becoming steadier Patient leans: N/A  Current Medications: Current Facility-Administered Medications  Medication Dose Route Frequency Provider Last Rate Last Dose  . albuterol (PROVENTIL HFA;VENTOLIN HFA) 108 (90 BASE) MCG/ACT inhaler 1-2 puff  1-2 puff Inhalation Q6H PRN Laverle Hobby, PA-C   2 puff at 04/18/14 0810  . alum & mag hydroxide-simeth (MAALOX/MYLANTA) 200-200-20 MG/5ML suspension 30 mL  30 mL Oral Q4H PRN Laverle Hobby, PA-C   30 mL at 04/18/14 1448  . magnesium hydroxide (MILK OF MAGNESIA) suspension 30 mL  30 mL Oral Daily PRN Laverle Hobby, PA-C      . mirtazapine (REMERON) tablet 15 mg  15 mg Oral QHS Shuvon Rankin, NP   15 mg at 04/17/14 2146  . multivitamin with minerals tablet 1 tablet  1 tablet Oral Daily Laverle Hobby, PA-C   1 tablet at 04/18/14 0809  . naproxen (NAPROSYN) tablet 500 mg  500 mg Oral BID PRN Laverle Hobby, PA-C   500 mg at 04/18/14 0315  .  nicotine (NICODERM CQ - dosed in mg/24 hours) patch 21 mg  21 mg Transdermal Daily Nicholaus Bloom, MD   21 mg at 04/18/14 0813  . risperiDONE (RISPERDAL) tablet 1 mg  1 mg Oral QHS Shuvon Rankin, NP   1 mg at 04/17/14 2146  . thiamine (VITAMIN B-1) tablet 100 mg  100 mg Oral Daily Laverle Hobby, PA-C   100 mg at 04/18/14 4403    Lab Results:  No results found for this or any previous visit (from the past 6 hour(s)).  Physical Findings: AIMS: Facial and Oral Movements Muscles of Facial Expression: None, normal Lips and Perioral Area: None, normal Jaw: None, normal Tongue: None,  normal,Extremity Movements Upper (arms, wrists, hands, fingers): None, normal Lower (legs, knees, ankles, toes): None, normal, Trunk Movements Neck, shoulders, hips: None, normal, Overall Severity Severity of abnormal movements (highest score from questions above): None, normal Incapacitation due to abnormal movements: None, normal Patient's awareness of abnormal movements (rate only patient's report): No Awareness, Dental Status Current problems with teeth and/or dentures?: No Does patient usually wear dentures?: No  CIWA:  CIWA-Ar Total: 6 COWS:  COWS Total Score: 4   Assessment:  Patient continues to improve and at present is not presenting with significant withdrawal symptoms. She is completing Librium taper. She is tolerating Remeron well . Her mood is more stable, but at present is calm, with behavior in good control. Appetites has improved, is starting to gain weight, and ambulation /gait is much improved at present . Treatment Plan Summary: Daily contact with patient to assess and evaluate symptoms and progress in treatment Medication management See below  Plan: 1.Continue inpatient treatment , milieu, support 2. Continue Libirum detox protocol 3. Consider discharge soon as she continues to improve/completes detox protocol 4. Continue Remeron 15 mgrs QHS 5. Continue Risperidone 1 mgr QHS  Medical Decision Making Problem Points:  Established problem, stable/improving (1), Review of last therapy session (1) and Review of psycho-social stressors (1) Data Points:  Review or order clinical lab tests (1) Review of medication regiment & side effects (2)  I certify that inpatient services furnished can reasonably be expected to improve the patient's condition.   Dereck Leep 04/18/2014, 3:15 PM

## 2014-04-18 NOTE — Progress Notes (Signed)
Psychoeducational Group Note  Date: 04/18/2014 Time:  1015  Group Topic/Focus:  Identifying Needs:   The focus of this group is to help patients identify their personal needs that have been historically problematic and identify healthy behaviors to address their needs.  Participation Level:  Active  Participation Quality:  Appropriate  Affect:  Appropriate  Cognitive:  Oriented  Insight:  Improving  Engagement in Group:  Engaged  Additional Comments:  Pt participated and was involved in the discussion.  Hermon Zea A 

## 2014-04-18 NOTE — Progress Notes (Signed)
Brittany Sanford is fairly acclimated to the unit today. .  She completes her morning assessment and on it she writes " 0/0/0" rating her feelings of depression, hopelessness and anxiety. She also writes that she denies experiencing SI, HI and / or presence of audit, vis, tactile halluc.   A She  attends her groups and is actively engaged in her poc as evidenced by participating in the group conversations as well as asking appropriate  Questions regarding her own recovery.She requested and was given naproxen for c/o pain, for which she stated "good" relief 1 hr later.   R Safety is in place and poc cont.

## 2014-04-18 NOTE — Progress Notes (Signed)
D. Pt had been up and visible and attended and participated in evening group activity. Pt reports that her day has been much better today than yesterday as she spoke of the rouse that was played against her regarding her sister. Pt does appear anxious on the unit but reports medications are beneficial to her and did receive them without incident. A. Support and encouragement provided, medication education given. R. Pt verbalized understanding, safety maintained.

## 2014-04-18 NOTE — Progress Notes (Signed)
.  Psychoeducational Group Note    Date: 04/18/2014 Time: 0930   Goal Setting Purpose of Group: To be able to set a goal that is measurable and that can be accomplished in one day Participation Level:  Did not attend  Dione HousekeeperJudge, Brice Kossman A

## 2014-04-19 ENCOUNTER — Encounter (HOSPITAL_COMMUNITY): Payer: Self-pay | Admitting: Registered Nurse

## 2014-04-19 DIAGNOSIS — F332 Major depressive disorder, recurrent severe without psychotic features: Secondary | ICD-10-CM

## 2014-04-19 MED ORDER — NAPROXEN 500 MG PO TABS: 500.0000 mg | ORAL_TABLET | Freq: Two times a day (BID) | ORAL | Status: DC | PRN

## 2014-04-19 MED ORDER — ADULT MULTIVITAMIN W/MINERALS CH: ORAL_TABLET | ORAL | Status: DC

## 2014-04-19 MED ORDER — MIRTAZAPINE 15 MG PO TABS: ORAL_TABLET | ORAL | Status: DC

## 2014-04-19 MED ORDER — RISPERIDONE 1 MG PO TABS: ORAL_TABLET | ORAL | Status: DC

## 2014-04-19 MED ORDER — THIAMINE HCL 100 MG PO TABS: 100.0000 mg | ORAL_TABLET | Freq: Every day | ORAL | Status: DC

## 2014-04-19 MED ORDER — ALBUTEROL SULFATE HFA 108 (90 BASE) MCG/ACT IN AERS: 1.0000 | INHALATION_SPRAY | Freq: Four times a day (QID) | RESPIRATORY_TRACT | Status: DC | PRN

## 2014-04-19 NOTE — Progress Notes (Signed)
D. Pt had been up and active in milieu this evening, attended and participated in group activity. Pt spoke about her impending discharge and how she was planning on going out to eat and do some shopping after discharge. Pt spoke about how she is committed to her continuing sobriety when she leaves.  A. Support and encouragement provided. R. Safety maintained, will continue to monitor.

## 2014-04-19 NOTE — Progress Notes (Addendum)
Pt completed morning assessment early this morning and returns it to staff. Pt documents no suicidality experienced within the past 24 hrs and she  rates her  depression, hopelessness and anxiety levels as "0/0/0", respectively. This nurse completes DC teaching with pt using DC AVS and pt states understanding  and willingness to comply as evidienced  By patient's demeanor and verbage that she uses, ie  " I know I gotta stay away from drinking". She is given med samples from pharmacy as well as  prescriptions for follow up meds. Again, pt stated understanding of material reviewed and this is evidenced by pt's ability to restate what is said to her. After teaching is completed, all belongings previously in pt's locker are returned to her and she signs release of property and then she is escorted to bldg entrance and DC'd to self care.

## 2014-04-19 NOTE — [facility] Suicide Risk Assessment (Signed)
   Demographic Factors:  Divorced or widowed and Caucasian  Total Time spent with patient: 30 minutes  Psychiatric Specialty Exam: Physical Exam  ROS  Blood pressure 119/86, pulse 92, temperature 97.6 F (36.4 C), temperature source Oral, resp. rate 20, height 4' 10.25" (1.48 m), weight 40.824 kg (90 lb), SpO2 98 %.Body mass index is 18.64 kg/(m^2).  General Appearance: Fairly Groomed  Patent attorneyye Contact::  Fair  Speech:  Normal Rate  Volume:  Normal  Mood:  Euthymic  Affect:  Appropriate, Congruent and Full Range  Thought Process:  Coherent and Goal Directed  Orientation:  Full (Time, Place, and Person)  Thought Content:  Negative  Suicidal Thoughts:  No  Homicidal Thoughts:  No  Memory:  Negative  Judgement:  Good  Insight:  Good  Psychomotor Activity:  Normal  Concentration:  Good  Recall:  Good  Fund of Knowledge:Good  Language: Good  Akathisia:  Negative  Handed:  Right  AIMS (if indicated):     Assets:  Communication Skills Desire for Improvement Financial Resources/Insurance Housing Physical Health Resilience Social Support Talents/Skills Vocational/Educational  Sleep:  Number of Hours: 5.5    Musculoskeletal: Strength & Muscle Tone: within normal limits Gait & Station: normal Patient leans: N/A   Mental Status Per Nursing Assessment::   On Admission:  NA  Current Mental Status by Physician: Pt reports doing well. Sleep/appetite good. Tolerating meds well. Pt denies SI/HI/AVH. Pt looking forward to working at her [employer].  Loss Factors: NA  Historical Factors: NA  Risk Reduction Factors:   Employed, Living with another person, especially a relative, Positive social support, Positive therapeutic relationship and Positive coping skills or problem solving skills  Continued Clinical Symptoms:  Bipolar Disorder:   Depressive phase Alcohol/Substance Abuse/Dependencies  Cognitive Features That Contribute To Risk:  Loss of executive function     Suicide Risk:  Minimal: No identifiable suicidal ideation.  Patients presenting with no risk factors but with morbid ruminations; may be classified as minimal risk based on the severity of the depressive symptoms  Discharge Diagnoses:   AXIS I:  Alcohol Abuse and Substance Induced Mood Disorder AXIS II:  Deferred AXIS III:   Past Medical History  Diagnosis Date  . Bronchitis   . Bipolar 1 disorder    AXIS IV:  other psychosocial or environmental problems AXIS V:  51-60 moderate symptoms  Plan Of Care/Follow-up recommendations:  Activity:  as tolerated Diet:  regular Tests:  per PCP Other:  [facility]. Pt will attend AA/NA. Pt denies current withdrawal symptoms or cravings.   Is patient on multiple antipsychotic therapies at discharge:  No   Has Patient had three or more failed trials of antipsychotic monotherapy by history:  No  Recommended Plan for Multiple Antipsychotic Therapies: NA    Brittany Sanford 04/19/2014, 10:07 AM

## 2014-04-19 NOTE — Discharge Summary (Signed)
Physician Discharge Summary Note  Patient:  Brittany Sanford is an 54 y.o., female MRN:  [id number] DOB:  1960-08-30 Patient phone:  [phone] (home)  Patient address:   [address] [place] [address],  Total Time spent with patient: Greater than 30 minutes  Date of Admission:  04/14/2014 Date of Discharge: 04/19/2013  Reason for Admission:  Patient states "I have started to drink from the time I get out of bed until I get back in bed and using cocaine($80 -$90) every day for the last 2 yrs now. I am usually a happy person but lately I am starting not to be that person anymore; I am starting to be depressed." Patient states that she also uses marijuana daily to help her eat. States that she has lost 12-20 in the last couple of months. Patient is requesting assistance with alcohol detox. Patient states that she is also hearing voices. "They are calling my name and saying stuff like Brittany Sanford come out and play; Brittany Sanford come with me."   Discharge Diagnoses: Principal Problem:   Alcohol use disorder, severe, dependence Active Problems:   Polysubstance abuse   Substance induced mood disorder   History of bipolar disorder   Psychiatric Specialty Exam:  See Suicide Risk Assessment Physical Exam  Constitutional: She is oriented to person, place, and time.  HENT:  Head: Normocephalic.  Neck: Normal range of motion.  Respiratory: Effort normal.  Musculoskeletal: Normal range of motion.  Neurological: She is alert and oriented to person, place, and time.  Psychiatric: Her mood appears anxious (Stable). She is not agitated and not actively hallucinating. Thought content is not paranoid and not delusional. Cognition and memory are normal. She exhibits a depressed mood (Stable). She expresses no homicidal and no suicidal ideation.  Mood has stabilized; judgement intact.      Review of Systems  Psychiatric/Behavioral: Positive for substance abuse. Negative for suicidal ideas and  hallucinations. Depression: Stable. The patient has insomnia (Stable ). Nervous/anxious: stabilized.     Blood pressure 119/86, pulse 92, temperature 97.6 F (36.4 C), temperature source Oral, resp. rate 20, height 4' 10.25" (1.48 m), weight 40.824 kg (90 lb), SpO2 98 %.Body mass index is 18.64 kg/(m^2).     Past Psychiatric History:   Diagnosis: Prior DX of Bipolar Disorder and PTSD  Hospitalizations: Inpatient for depression and alcohol abuse  Outpatient Care: Medication management and counseling  Substance Abuse Care: Alcohol, Benzo, THC, and Cocaine  Self-Mutilation: Denies  Suicidal Attempts:Denies  Violent Behaviors:Denies     Musculoskeletal: Strength & Muscle Tone: within normal limits Gait & Station: normal Patient leans: N/A  DSM5:  Schizophrenia Disorders:  N/A Obsessive-Compulsive Disorders:  N/A Trauma-Stressor Disorders:  Posttraumatic Stress Disorder (309.81) Substance/Addictive Disorders:  Alcohol Related Disorder - Severe (303.90), Cannabis Use Disorder - Severe (304.30) and Opioid Disorder - Mild (305.50) Depressive Disorders:  Major Depressive Disorder - Severe (296.23)  Axis Diagnosis:   AXIS I:  Alcohol Abuse, Major Depression, Recurrent severe, Substance Abuse and Substance Induced Mood Disorder AXIS II:  Deferred AXIS III:   Past Medical History  Diagnosis Date  . Bronchitis   . Bipolar 1 disorder    AXIS IV:  other psychosocial or environmental problems AXIS V:  61-70 mild symptoms  Level of Care:  OP  Hospital Course:    Brittany Sanford was admitted forALCOHOL USE DISORDER,SEVERE COCAINE USE DISORDER,SEVERE CANNABIS USE DISORDER,MILD and crisis management.  She was treated with Remeron 15 mg for Major Depression and Insomnia;  Risperidone 1 mg for Mood Control and Psychosis; Librium Protocol for Alcohol Detox.  Medical problems were identified and treated as needed.  Home medications were restarted as appropriate.  Improvement was  monitored by observation and Brittany Sanford daily report of symptom reduction.  Emotional and mental status was monitored daily by self inventory reports completed by the Brittany Sanford and clinical staff.         Brittany Sanford was evaluated by the treatment team for stability and plans for continued recovery upon discharge.  She was offered further treatment options upon discharge including Residential, Intensive Outpatient and Outpatient treatment in which she refused. She will follow up with [facility] for medication management and counseling.     Brittany Sanford motivation was an integral factor for scheduling further treatment.  Employment, transportation, bed availability, health status, family support, and any pending legal issues were also considered during her hospital stay.  Upon completion of this admission the patient was both and mentally and medically stable for discharge denying suicidal/homicidal ideation, auditory/visual/tactile hallucinations, delusional thoughts and paranoia.       Consults:  psychiatry  Significant Diagnostic Studies:  labs: UDS, Urinalysis, CBC/Diff, CMET, ETOH  Discharge Vitals:   Blood pressure 119/86, pulse 92, temperature 97.6 F (36.4 C), temperature source Oral, resp. rate 20, height 4' 10.25" (1.48 m), weight 40.824 kg (90 lb), SpO2 98 %. Body mass index is 18.64 kg/(m^2). Lab Results:   No results found for this or any previous visit (from the past 72 hour(s)).  Physical Findings: AIMS: Facial and Oral Movements Muscles of Facial Expression: None, normal Lips and Perioral Area: None, normal Jaw: None, normal Tongue: None, normal,Extremity Movements Upper (arms, wrists, hands, fingers): None, normal Lower (legs, knees, ankles, toes): None, normal, Trunk Movements Neck, shoulders, hips: None, normal, Overall Severity Severity of abnormal movements (highest score from questions above): None, normal Incapacitation due to abnormal  movements: None, normal Patient's awareness of abnormal movements (rate only patient's report): No Awareness, Dental Status Current problems with teeth and/or dentures?: No Does patient usually wear dentures?: No  CIWA:  CIWA-Ar Total: 0 COWS:  COWS Total Score: 4  Psychiatric Specialty Exam: See Psychiatric Specialty Exam and Suicide Risk Assessment completed by Attending Physician prior to discharge.  Discharge destination:  Home  Is patient on multiple antipsychotic therapies at discharge:  No   Has Patient had three or more failed trials of antipsychotic monotherapy by history:  No  Recommended Plan for Multiple Antipsychotic Therapies: NA  Discharge Instructions    [facility] pharmacy consult for medication samples    Complete by:  As directed   14 day sample supply     Discharge instructions    Complete by:  As directed   Take all of you medications as prescribed by your mental healthcare provider.  Report any adverse effects and reactions from your medications to your outpatient provider promptly. Do not engage in alcohol and or illegal drug use while on prescription medicines. In the event of worsening symptoms call the crisis hotline, 911, and or go to the nearest emergency department for appropriate evaluation and treatment of symptoms. Follow-up with your primary care provider for your medical issues, concerns and or health care needs.   Keep all scheduled appointments.  If you are unable to keep an appointment call to reschedule.  Let the nurse know if you will need medications before next scheduled appointment.  Medication List    STOP taking these medications        ibuprofen 600 MG tablet  Commonly known as:  ADVIL,MOTRIN      TAKE these medications      Indication   albuterol 108 (90 BASE) MCG/ACT inhaler  Commonly known as:  PROVENTIL HFA;VENTOLIN HFA  Inhale 1-2 puffs into the lungs every 6 (six) hours as needed for wheezing or shortness of breath.    Indication:  Asthma     mirtazapine 15 MG tablet  Commonly known as:  REMERON  Take 15 mg (1 tablet) at bedtime for Major Depression and Sleep   Indication:  Trouble Sleeping, Major Depressive Disorder     multivitamin with minerals Tabs tablet  Take one tablet daily for nutritional support   Indication:  Nutritional Support     naproxen 500 MG tablet  Commonly known as:  NAPROSYN  Take 1 tablet (500 mg total) by mouth 2 (two) times daily as needed for mild pain, moderate pain or headache.      risperiDONE 1 MG tablet  Commonly known as:  RISPERDAL  Take 1 mg (one tablet) at bedtime for mood control and psychosis   Indication:  Easily Angered or Annoyed, Mood stabilizaton     thiamine 100 MG tablet  Take 1 tablet (100 mg total) by mouth daily. Thiamine deficiency   Indication:  Deficiency in Thiamine or Vitamin B1           Follow-up Information    [facility]   Why:  [facility] on Wednesday, April 22, 2014 or any weekday between 8AM - 12Noon or 1PM - 3 PM for medication management/counseling   Contact information:   [address] [place], [place]   [id number]  [phone]      Follow-up recommendations:  Activity:  As tolerated Diet:  As tolerated Other:  [facility]  Comments:   Patient has been instructed to  Take medications as prescribed.  Report adverse effects to outpatient provider.  Follow up with primary doctor for any medical issues.  If symptoms recur report to nearest emergency or crisis hot line.    Total Discharge Time:  Greater than 30 minutes.  SignedAssunta Found: Yanil Dawe, FNP-BC 04/19/2014, 9:19 AM

## 2014-04-19 NOTE — Progress Notes (Signed)
[facility] :  Will you be returning to the same living situation after discharge: Yes,  returning home At discharge, do you have transportation home?:Yes,  friend is picking pt up Do you have the ability to pay for your medications:Yes,  provided pt with prescriptions and pt reports ability to get meds  Release of information consent forms completed and in the chart;  Patient's signature needed at discharge.  Patient to Follow up at: Follow-up Information    [facility].   Why:  [facility] on Wednesday, April 22, 2014 or any weekday between 8AM - 12Noon or 1PM - 3 PM for medication management/counseling   Contact information:   [address] [place], [place]   [address]  [phone]      Patient denies SI/HI:   Yes,  denies SI/HI    Safety Planning and Suicide Prevention discussed:  Yes,  pt refused.  Discussed with pt.   [facility]   Letter for court provided for pt today, per her request. See chart (communication management) for copy.    Brittany MillerHorton, Brittany Sanford 04/19/2014, 10:40 AM

## 2014-04-22 NOTE — Progress Notes (Signed)
Patient Discharge Instructions:  After Visit Summary (AVS):   Faxed to:  04/22/14 Discharge Summary Note:   Faxed to:  04/22/14 Psychiatric Admission Assessment Note:   Faxed to:  04/22/14 Suicide Risk Assessment - Discharge Assessment:   Faxed to:  04/22/14 Faxed/Sent to the Next Level Care provider:  04/22/14 [facility] @ [phone] Jerelene ReddenSheena E [place], 04/22/2014, 3:21 PM

## 2014-09-05 ENCOUNTER — Encounter: Payer: Self-pay | Admitting: Emergency Medicine

## 2014-09-05 ENCOUNTER — Emergency Department: Payer: Self-pay

## 2014-09-05 ENCOUNTER — Inpatient Hospital Stay
Admission: EM | Admit: 2014-09-05 | Discharge: 2014-09-07 | DRG: 690 | Disposition: A | Discharge status: Home / Self Care | Payer: Self-pay | Attending: Internal Medicine | Admitting: Internal Medicine

## 2014-09-05 DIAGNOSIS — N3001 Acute cystitis with hematuria: Secondary | ICD-10-CM

## 2014-09-05 DIAGNOSIS — F17210 Nicotine dependence, cigarettes, uncomplicated: Secondary | ICD-10-CM | POA: Diagnosis present

## 2014-09-05 DIAGNOSIS — N12 Tubulo-interstitial nephritis, not specified as acute or chronic: Principal | ICD-10-CM

## 2014-09-05 DIAGNOSIS — R0602 Shortness of breath: Secondary | ICD-10-CM | POA: Diagnosis present

## 2014-09-05 DIAGNOSIS — J441 Chronic obstructive pulmonary disease with (acute) exacerbation: Secondary | ICD-10-CM | POA: Diagnosis present

## 2014-09-05 DIAGNOSIS — R1032 Left lower quadrant pain: Secondary | ICD-10-CM

## 2014-09-05 DIAGNOSIS — J439 Emphysema, unspecified: Secondary | ICD-10-CM

## 2014-09-05 DIAGNOSIS — R519 Headache, unspecified: Secondary | ICD-10-CM

## 2014-09-05 DIAGNOSIS — R51 Headache: Secondary | ICD-10-CM | POA: Diagnosis present

## 2014-09-05 HISTORY — DX: Other specified behavioral and emotional disorders with onset usually occurring in childhood and adolescence: F98.8

## 2014-09-05 HISTORY — DX: Attention-deficit hyperactivity disorder, unspecified type: F90.9

## 2014-09-05 LAB — URINALYSIS COMPLETE WITH MICROSCOPIC (ARMC ONLY)
Bilirubin Urine: NEGATIVE
GLUCOSE, UA: NEGATIVE mg/dL
KETONES UR: NEGATIVE mg/dL
NITRITE: NEGATIVE
Protein, ur: 100 mg/dL — AB
Specific Gravity, Urine: 1.018 (ref 1.005–1.030)
pH: 5 (ref 5.0–8.0)

## 2014-09-05 LAB — COMPREHENSIVE METABOLIC PANEL
ALK PHOS: 69 U/L (ref 38–126)
ALT: 14 U/L (ref 14–54)
AST: 25 U/L (ref 15–41)
Albumin: 3.5 g/dL (ref 3.5–5.0)
Anion gap: 10 (ref 5–15)
BUN: 17 mg/dL (ref 6–20)
CALCIUM: 8.2 mg/dL — AB (ref 8.9–10.3)
CHLORIDE: 100 mmol/L — AB (ref 101–111)
CO2: 25 mmol/L (ref 22–32)
Creatinine, Ser: 1.01 mg/dL — ABNORMAL HIGH (ref 0.44–1.00)
GFR calc Af Amer: >60 mL/min (ref >60)
GFR calc non Af Amer: >60 mL/min (ref >60)
Glucose, Bld: 111 mg/dL — ABNORMAL HIGH (ref 65–99)
Potassium: 3.5 mmol/L (ref 3.5–5.1)
SODIUM: 135 mmol/L (ref 135–145)
Total Bilirubin: 0.7 mg/dL (ref 0.3–1.2)
Total Protein: 7.3 g/dL (ref 6.5–8.1)

## 2014-09-05 LAB — CBC
HEMATOCRIT: 34.3 % — AB (ref 35.0–47.0)
Hemoglobin: 11.5 g/dL — ABNORMAL LOW (ref 12.0–16.0)
MCH: 29.4 pg (ref 26.0–34.0)
MCHC: 33.5 g/dL (ref 32.0–36.0)
MCV: 87.8 fL (ref 80.0–100.0)
Platelets: 184 10*3/uL (ref 150–440)
RBC: 3.91 MIL/uL (ref 3.80–5.20)
RDW: 13 % (ref 11.5–14.5)
WBC: 9.8 10*3/uL (ref 3.6–11.0)

## 2014-09-05 LAB — TROPONIN I: TROPONIN I: 0.03 ng/mL (ref <0.031)

## 2014-09-05 MED ORDER — SODIUM CHLORIDE 0.9 % IV SOLN
1000.0000 mL | INTRAVENOUS | Status: DC
  Administered 2014-09-05: 1000 mL via INTRAVENOUS

## 2014-09-05 MED ORDER — LEVOFLOXACIN IN D5W 500 MG/100ML IV SOLN
500.0000 mg | Freq: Once | INTRAVENOUS | Status: AC
  Administered 2014-09-05: 500 mg via INTRAVENOUS

## 2014-09-05 MED ORDER — METHYLPREDNISOLONE SODIUM SUCC 125 MG IJ SOLR
INTRAMUSCULAR | Status: AC
  Administered 2014-09-05: 60 mg via INTRAVENOUS
  Filled 2014-09-05: qty 2

## 2014-09-05 MED ORDER — IPRATROPIUM-ALBUTEROL 0.5-2.5 (3) MG/3ML IN SOLN
3.0000 mL | Freq: Once | RESPIRATORY_TRACT | Status: AC
  Administered 2014-09-05: 3 mL via RESPIRATORY_TRACT

## 2014-09-05 MED ORDER — IOHEXOL 300 MG/ML  SOLN
75.0000 mL | Freq: Once | INTRAMUSCULAR | Status: AC | PRN
  Administered 2014-09-05: 75 mL via INTRAVENOUS

## 2014-09-05 MED ORDER — METHYLPREDNISOLONE SODIUM SUCC 125 MG IJ SOLR
60.0000 mg | Freq: Once | INTRAMUSCULAR | Status: AC
  Administered 2014-09-05: 60 mg via INTRAVENOUS

## 2014-09-05 MED ORDER — ONDANSETRON HCL 4 MG/2ML IJ SOLN
INTRAMUSCULAR | Status: AC
  Administered 2014-09-05: 4 mg via INTRAVENOUS
  Filled 2014-09-05: qty 2

## 2014-09-05 MED ORDER — ONDANSETRON HCL 4 MG/2ML IJ SOLN
4.0000 mg | Freq: Once | INTRAMUSCULAR | Status: AC
  Administered 2014-09-05: 4 mg via INTRAVENOUS

## 2014-09-05 MED ORDER — SODIUM CHLORIDE 0.9 % IV SOLN
1000.0000 mL | Freq: Once | INTRAVENOUS | Status: AC
  Administered 2014-09-05: 1000 mL via INTRAVENOUS

## 2014-09-05 MED ORDER — LEVOFLOXACIN IN D5W 500 MG/100ML IV SOLN
INTRAVENOUS | Status: AC
  Administered 2014-09-05: 500 mg via INTRAVENOUS
  Filled 2014-09-05: qty 100

## 2014-09-05 MED ORDER — IPRATROPIUM-ALBUTEROL 0.5-2.5 (3) MG/3ML IN SOLN
RESPIRATORY_TRACT | Status: AC
  Administered 2014-09-05: 3 mL via RESPIRATORY_TRACT
  Filled 2014-09-05: qty 3

## 2014-09-05 MED ORDER — IOHEXOL 240 MG/ML SOLN
25.0000 mL | INTRAMUSCULAR | Status: AC
  Administered 2014-09-05 (×2): 25 mL via ORAL

## 2014-09-05 NOTE — ED Notes (Signed)
Pt with multiple complaints for 4 days; smoker; sinus drainage; left lower quadrant pain; talking in complete coherent sentences

## 2014-09-05 NOTE — ED Provider Notes (Signed)
[facility] Emergency Department Provider Note  ____________________________________________  Time seen: 2020  I have reviewed the triage vital signs and the nursing notes.   HISTORY  Chief Complaint Chest Pain; Dizziness; Abdominal Pain; Headache; and Dysuria     HPI Brittany Sanford is a 54 y.o. female with multiple complaints. She complains of abdominal pain, cough and shortness of breath, and headache. She also blew her nose this evening and noted a small amount of blood. This worried her enough to bring her into the emergency department even know she has had the other symptoms for a couple of days.   She reports she has had left-sided abdominal pain for the past 4 days. 4 days ago she also began to have the headache. The abdominal pain is unlike pain she has had in the past. She has no history of diverticulitis. The pain is moderate and is in the left lower quadrant. She reports the pain gets a little bit worse when she peas. She does have a sense of the need to urinate but no dysuria.   She also  complains of some increased shortness of breath, coughing, and sinus drainage over the past 4 days. She does smoke cigarettes and has a history of chronic bronchitis but is not on any medications currently. She ran out of her inhaler. She does not have her primary care physician.     Past Medical History  Diagnosis Date  . Bronchitis   . Bipolar 1 disorder   . Attention deficit disorder   . ADHD (attention deficit hyperactivity disorder)     Patient Active Problem List   Diagnosis Date Noted  . History of bipolar disorder 04/15/2014  . Alcohol use disorder, severe, dependence 04/14/2014  . Bipolar affective disorder 04/14/2014  . Substance induced mood disorder 04/14/2014  . Alcohol abuse   . Polysubstance abuse     History reviewed. No pertinent past surgical history.  Current Outpatient Rx  Name  Route  Sig  Dispense  Refill  . albuterol (PROVENTIL  HFA;VENTOLIN HFA) 108 (90 BASE) MCG/ACT inhaler   Inhalation   Inhale 1-2 puffs into the lungs every 6 (six) hours as needed for wheezing or shortness of breath.   1 Inhaler   0   . mirtazapine (REMERON) 15 MG tablet      Take 15 mg (1 tablet) at bedtime for Major Depression and Sleep   30 tablet   0   . Multiple Vitamin (MULTIVITAMIN WITH MINERALS) TABS tablet      Take one tablet daily for nutritional support   30 tablet   0   . naproxen (NAPROSYN) 500 MG tablet   Oral   Take 1 tablet (500 mg total) by mouth 2 (two) times daily as needed for mild pain, moderate pain or headache.   60 tablet   0   . risperiDONE (RISPERDAL) 1 MG tablet      Take 1 mg (one tablet) at bedtime for mood control and psychosis   30 tablet   0   . thiamine 100 MG tablet   Oral   Take 1 tablet (100 mg total) by mouth daily. Thiamine deficiency   30 tablet   0     Allergies Darvocet  No family history on file.  Social History History  Substance Use Topics  . Smoking status: Current Every Day Smoker -- 1.00 packs/day  . Smokeless tobacco: Never Used  . Alcohol Use: Yes    Review of Systems  Constitutional: Negative for fever. Positive for weight loss. She reports she is trying to increase her weight but she is now down from 110 into the 80 pound range. ENT: Negative for sore throat. Cardiovascular: Negative for chest pain. Respiratory:Shortness of breath. See history of present illness.  Gastrointestinal: Negative for abdominal pain, vomiting and diarrhea. Genitourinary: Negative for dysuria. Musculoskeletal: Negative for back pain. Skin: Negative for rash. Neurological:Positiver headaches   10-point ROS otherwise negative.  ____________________________________________   PHYSICAL EXAM:  VITAL SIGNS: ED Triage Vitals  Enc Vitals Group     BP 09/05/14 1933 74/45 mmHg     Pulse Rate 09/05/14 1933 118     Resp 09/05/14 1933 17     Temp 09/05/14 1933 98.1 F (36.7 C)      Temp src --      SpO2 09/05/14 1933 98 %     Weight 09/05/14 1933 87 lb (39.463 kg)     Height 09/05/14 1933  (1.499 m)     Head Cir --      Peak Flow --      Pain Score 09/05/14 1934 8     Pain Loc --      Pain Edu? --      Excl. in GC? --     Constitutional:Thin 54 year old female who looks older than stated age.  ENT   Head: Normocephalic and atraumatic.   Nose: No congestion/rhinnorhea.   Mouth/Throat: Mucous membranes are moist. Cardiovascular:Tachycardic at 110 to 1:15 after nebulized breathing treatment.  Respiratory:Diminished breath sounds bilaterally. No noted wheezing at this time.  Gastrointestinal:Abdomen is soft but tender in the left lower quadrant. She has no rebound or referred pain.  Back: No muscle spasm, no tenderness, no CVA tenderness. Musculoskeletal: Nontender with normal range of motion in all extremities.  No noted edema. Neurologic:  Normal speech and language. No gross focal neurologic deficits are appreciated.  Skin:  Skin is warm, dry. No rash noted. Psychiatric: Mood and affect are normal. Speech and behavior are normal.  ____________________________________________    LABS (pertinent positives/negatives)  White blood cell 9.8 hemoglobin of 11.5 Follow-up panel overall unremarkable areas  troponin 0.03 Urinalysis white blood cells and red blood cells too numerous to count. ____________________________________________   EKG  ED ECG REPORT I, Josue Falconi W, the attending physician, personally viewed and interpreted this ECG.   Date: 09/05/2014  EKG Time: 1942  Rate: 106  Rhythm: Sinus tachycardia  Axis:  Normal  Intervals:  Normal    ____________________________________________    RADIOLOGY  CT head negative  CT chest, abdomen and pelvis:  IMPRESSION: 1. Left pyelonephritis without hydronephrosis or abscess. 2. Emphysema. 3. Thoracic and thoracolumbar hemivertebra with scoliosis.  Chest  x-ray:  IMPRESSION: COPD without acute abnormality.    ____________________________________________   ____________________________________________   INITIAL IMPRESSION / ASSESSMENT AND PLAN / ED COURSE  Patient has lower abdominal pain that is consistent with possible diverticulitis. She does not seem to have a [place] of urinary symptoms. We will do a CT scan to evaluate for diverticulitis or belly. As she is complaining of a headache as well and the CT with IV contrast would limit any future head CT if it was needed due to ongoing headache, I feel it is best to scan through her brain at this time as well before we do a CT with IV contrast. The patient has notable weight loss. She clearly has underlying lung disease. She appears frail and ill. We will scan through her  chest as well.  ----------------------------------------- 11:02 PM on 09/05/2014 -----------------------------------------  Patient has a notable urinary tract infection. CT images are pending. We will treat her with Levaquin for the UTI with cross coverage to pulmonary.  ----------------------------------------- 12:28 AM on 09/06/2014 -----------------------------------------  CT does not show diverticulitis but does show pyelonephritis. She is ongoing tachycardia at 125. We will admit her to the hospital. We've treated her with Levaquin.  ____________________________________________   FINAL CLINICAL IMPRESSION(S) / ED DIAGNOSES  Final diagnoses:  Pulmonary emphysema, unspecified emphysema type  Left lower quadrant pain  Acute cystitis with hematuria  Acute nonintractable headache, unspecified headache type  Pyelonephritis      Darien Ramus, MD 09/06/14 0030

## 2014-09-06 ENCOUNTER — Encounter: Payer: Self-pay | Admitting: Internal Medicine

## 2014-09-06 DIAGNOSIS — N12 Tubulo-interstitial nephritis, not specified as acute or chronic: Secondary | ICD-10-CM | POA: Diagnosis present

## 2014-09-06 DIAGNOSIS — R0602 Shortness of breath: Secondary | ICD-10-CM | POA: Diagnosis present

## 2014-09-06 LAB — CBC
HCT: 27.6 % — ABNORMAL LOW (ref 35.0–47.0)
Hemoglobin: 9.5 g/dL — ABNORMAL LOW (ref 12.0–16.0)
MCH: 29.8 pg (ref 26.0–34.0)
MCHC: 34.2 g/dL (ref 32.0–36.0)
MCV: 87 fL (ref 80.0–100.0)
Platelets: 153 10*3/uL (ref 150–440)
RBC: 3.18 MIL/uL — AB (ref 3.80–5.20)
RDW: 13 % (ref 11.5–14.5)
WBC: 8.1 10*3/uL (ref 3.6–11.0)

## 2014-09-06 LAB — BASIC METABOLIC PANEL
ANION GAP: 7 (ref 5–15)
BUN: 14 mg/dL (ref 6–20)
CALCIUM: 7.3 mg/dL — AB (ref 8.9–10.3)
CO2: 23 mmol/L (ref 22–32)
CREATININE: 0.96 mg/dL (ref 0.44–1.00)
Chloride: 103 mmol/L (ref 101–111)
GFR calc non Af Amer: >60 mL/min (ref >60)
GLUCOSE: 239 mg/dL — AB (ref 65–99)
Potassium: 2.9 mmol/L — CL (ref 3.5–5.1)
Sodium: 133 mmol/L — ABNORMAL LOW (ref 135–145)

## 2014-09-06 LAB — URINE DRUG SCREEN, QUALITATIVE (ARMC ONLY)
AMPHETAMINES, UR SCREEN: NOT DETECTED
BENZODIAZEPINE, UR SCRN: NOT DETECTED
Barbiturates, Ur Screen: NOT DETECTED
CANNABINOID 50 NG, UR ~~LOC~~: NOT DETECTED
Cocaine Metabolite,Ur ~~LOC~~: POSITIVE — AB
MDMA (ECSTASY) UR SCREEN: NOT DETECTED
Methadone Scn, Ur: NOT DETECTED
OPIATE, UR SCREEN: NOT DETECTED
Phencyclidine (PCP) Ur S: NOT DETECTED
TRICYCLIC, UR SCREEN: NOT DETECTED

## 2014-09-06 LAB — POTASSIUM: Potassium: 3.6 mmol/L (ref 3.5–5.1)

## 2014-09-06 MED ORDER — POTASSIUM CHLORIDE CRYS ER 20 MEQ PO TBCR
40.0000 meq | EXTENDED_RELEASE_TABLET | Freq: Once | ORAL | Status: AC
  Administered 2014-09-06: 40 meq via ORAL
  Filled 2014-09-06: qty 2

## 2014-09-06 MED ORDER — IPRATROPIUM-ALBUTEROL 0.5-2.5 (3) MG/3ML IN SOLN: 3.0000 mL | Freq: Four times a day (QID) | RESPIRATORY_TRACT | Status: DC

## 2014-09-06 MED ORDER — ACETAMINOPHEN 650 MG RE SUPP: 650.0000 mg | Freq: Four times a day (QID) | RECTAL | Status: DC | PRN

## 2014-09-06 MED ORDER — ALBUTEROL SULFATE (2.5 MG/3ML) 0.083% IN NEBU: 2.5000 mg | INHALATION_SOLUTION | Freq: Four times a day (QID) | RESPIRATORY_TRACT | Status: DC | PRN

## 2014-09-06 MED ORDER — ONDANSETRON HCL 4 MG PO TABS: 4.0000 mg | ORAL_TABLET | Freq: Four times a day (QID) | ORAL | Status: DC | PRN

## 2014-09-06 MED ORDER — IPRATROPIUM-ALBUTEROL 0.5-2.5 (3) MG/3ML IN SOLN
RESPIRATORY_TRACT | Status: AC
  Administered 2014-09-06: 3 mL via RESPIRATORY_TRACT
  Filled 2014-09-06: qty 3

## 2014-09-06 MED ORDER — LEVOFLOXACIN IN D5W 250 MG/50ML IV SOLN
250.0000 mg | INTRAVENOUS | Status: DC
  Administered 2014-09-06: 250 mg via INTRAVENOUS
  Filled 2014-09-06 (×2): qty 50

## 2014-09-06 MED ORDER — ALBUTEROL SULFATE HFA 108 (90 BASE) MCG/ACT IN AERS: 1.0000 | INHALATION_SPRAY | Freq: Four times a day (QID) | RESPIRATORY_TRACT | Status: DC | PRN

## 2014-09-06 MED ORDER — BUTALBITAL-APAP-CAFFEINE 50-325-40 MG PO TABS
2.0000 | ORAL_TABLET | Freq: Once | ORAL | Status: AC
  Administered 2014-09-06: 2 via ORAL
  Filled 2014-09-06: qty 2

## 2014-09-06 MED ORDER — IBUPROFEN 400 MG PO TABS
ORAL_TABLET | ORAL | Status: AC
  Administered 2014-09-06: 400 mg via ORAL
  Filled 2014-09-06: qty 1

## 2014-09-06 MED ORDER — IPRATROPIUM-ALBUTEROL 0.5-2.5 (3) MG/3ML IN SOLN
3.0000 mL | RESPIRATORY_TRACT | Status: DC
  Administered 2014-09-06 (×3): 3 mL via RESPIRATORY_TRACT
  Filled 2014-09-06 (×4): qty 3

## 2014-09-06 MED ORDER — ACETAMINOPHEN 325 MG PO TABS
650.0000 mg | ORAL_TABLET | Freq: Four times a day (QID) | ORAL | Status: DC | PRN
  Administered 2014-09-06 (×2): 650 mg via ORAL
  Filled 2014-09-06 (×2): qty 2

## 2014-09-06 MED ORDER — ONDANSETRON HCL 4 MG/2ML IJ SOLN: 4.0000 mg | Freq: Four times a day (QID) | INTRAMUSCULAR | Status: DC | PRN

## 2014-09-06 MED ORDER — IBUPROFEN 400 MG PO TABS
400.0000 mg | ORAL_TABLET | Freq: Once | ORAL | Status: AC
  Administered 2014-09-06: 400 mg via ORAL

## 2014-09-06 MED ORDER — MIRTAZAPINE 15 MG PO TABS
15.0000 mg | ORAL_TABLET | Freq: Every day | ORAL | Status: DC
  Administered 2014-09-06: 15 mg via ORAL
  Filled 2014-09-06: qty 1

## 2014-09-06 MED ORDER — SODIUM CHLORIDE 0.9 % IV SOLN
INTRAVENOUS | Status: DC
  Administered 2014-09-06: 03:00:00 via INTRAVENOUS

## 2014-09-06 MED ORDER — ADULT MULTIVITAMIN W/MINERALS CH
1.0000 | ORAL_TABLET | Freq: Every day | ORAL | Status: DC
  Administered 2014-09-06 – 2014-09-07 (×2): 1 via ORAL
  Filled 2014-09-06 (×2): qty 1

## 2014-09-06 MED ORDER — IPRATROPIUM-ALBUTEROL 0.5-2.5 (3) MG/3ML IN SOLN
3.0000 mL | Freq: Once | RESPIRATORY_TRACT | Status: AC
  Administered 2014-09-06: 3 mL via RESPIRATORY_TRACT

## 2014-09-06 MED ORDER — METHYLPREDNISOLONE SODIUM SUCC 40 MG IJ SOLR
40.0000 mg | Freq: Four times a day (QID) | INTRAMUSCULAR | Status: DC
  Administered 2014-09-06 (×2): 40 mg via INTRAVENOUS
  Filled 2014-09-06 (×2): qty 1

## 2014-09-06 MED ORDER — ENOXAPARIN SODIUM 40 MG/0.4ML ~~LOC~~ SOLN
40.0000 mg | SUBCUTANEOUS | Status: DC
  Administered 2014-09-06 – 2014-09-07 (×2): 40 mg via SUBCUTANEOUS
  Filled 2014-09-06 (×2): qty 0.4

## 2014-09-06 MED ORDER — VITAMIN B-1 100 MG PO TABS
100.0000 mg | ORAL_TABLET | Freq: Every day | ORAL | Status: DC
  Administered 2014-09-06 – 2014-09-07 (×2): 100 mg via ORAL
  Filled 2014-09-06 (×2): qty 1

## 2014-09-06 MED ORDER — NICOTINE 21 MG/24HR TD PT24
21.0000 mg | MEDICATED_PATCH | Freq: Every day | TRANSDERMAL | Status: DC
  Administered 2014-09-06 – 2014-09-07 (×2): 21 mg via TRANSDERMAL
  Filled 2014-09-06 (×3): qty 1

## 2014-09-06 MED ORDER — ASPIRIN EC 81 MG PO TBEC
81.0000 mg | DELAYED_RELEASE_TABLET | Freq: Every day | ORAL | Status: DC
  Administered 2014-09-06 – 2014-09-07 (×2): 81 mg via ORAL
  Filled 2014-09-06 (×2): qty 1

## 2014-09-06 MED ORDER — IPRATROPIUM-ALBUTEROL 20-100 MCG/ACT IN AERS: 2.0000 | INHALATION_SPRAY | Freq: Four times a day (QID) | RESPIRATORY_TRACT | Status: DC

## 2014-09-06 MED ORDER — RISPERIDONE 1 MG PO TABS
1.0000 mg | ORAL_TABLET | Freq: Every day | ORAL | Status: DC
  Administered 2014-09-06: 1 mg via ORAL
  Filled 2014-09-06 (×2): qty 1

## 2014-09-06 MED ORDER — DIPHENHYDRAMINE HCL 25 MG PO CAPS
25.0000 mg | ORAL_CAPSULE | Freq: Every evening | ORAL | Status: DC | PRN
  Administered 2014-09-06: 25 mg via ORAL
  Filled 2014-09-06: qty 1

## 2014-09-06 MED ORDER — FAMOTIDINE 20 MG PO TABS
20.0000 mg | ORAL_TABLET | Freq: Every day | ORAL | Status: DC
  Administered 2014-09-06 – 2014-09-07 (×2): 20 mg via ORAL
  Filled 2014-09-06 (×2): qty 1

## 2014-09-06 NOTE — H&P (Signed)
[facility] [place] [facility]   PATIENT NAME: Brittany FlingLesa Sanford    MR#:  [id number]  DATE OF BIRTH:  [birth year]  DATE OF ADMISSION:  09/05/2014  PRIMARY CARE PHYSICIAN: Default, Provider, MD   REQUESTING/REFERRING PHYSICIAN: Janalyn Harderavid Kaminski  CHIEF COMPLAINT:   Chief Complaint  Patient presents with  . Chest Pain  . Dizziness  . Abdominal Pain  . Headache  . Dysuria   patient presents with multiple complaints but primary complaints are 1. Left-sided abdominal pain 2. Shortness of breath with wheezing and cough 3. Headache, malaise  HISTORY OF PRESENT ILLNESS:  Brittany Sanford  is a 54 y.o. female with below mentioned past medical history, not seen any doctor for the past many years, and not on any medications at present, comes to the emergency room with the complaints of left-sided abdominal pain associated with some nausea. Denies any fever, vomitings, diarrhea. Does complain of frequency of urination but no dysuria, no hematuria. Patient also complains of shortness of breath with wheezing and cough for the past few days. Does have some vague chest discomfort which is nonexertional. Also complains of headache which is chronic. In the emergency room patient was evaluated by the ED physician and was found to be with stable vital signs, labs unremarkable except for urinalysis positive for significant UTI. EKG showed sinus tachycardia with ventricular rate of 10 6 bpm. Chest x-ray shows COPD changes but no pneumonia. CT head noncontrast study negative for any acute IC pathology. CT of the chest abdomen and the pelvis shows left pyelonephritis. Patient was given IV Levaquin, salmeterol, albuterol nebs and hospitalist service was consulted for further management.  PAST MEDICAL HISTORY:   Past Medical History  Diagnosis Date  . Bronchitis   . Bipolar 1 disorder   . Attention deficit disorder   . ADHD (attention deficit hyperactivity disorder)     PAST SURGICAL  HISTORY:  History reviewed. No pertinent past surgical history.  SOCIAL HISTORY:   History  Substance Use Topics  . Smoking status: Current Every Day Smoker -- 1.00 packs/day  . Smokeless tobacco: Never Used  . Alcohol Use: Yes    FAMILY HISTORY:   Family History  Problem Relation Age of Onset  . COPD Mother   . Cancer Father     DRUG ALLERGIES:   Allergies  Allergen Reactions  . Darvocet [Propoxyphene N-Acetaminophen] Nausea And Vomiting    REVIEW OF SYSTEMS:   Review of Systems  Constitutional: Positive for weight loss and malaise/fatigue. Negative for fever and chills.  HENT: Positive for congestion. Negative for ear pain, hearing loss, nosebleeds, sore throat and tinnitus.   Eyes: Negative for blurred vision, double vision, pain, discharge and redness.  Respiratory: Positive for cough, sputum production, shortness of breath and wheezing. Negative for hemoptysis.   Cardiovascular: Negative for chest pain, palpitations, orthopnea and leg swelling.  Gastrointestinal: Positive for nausea and abdominal pain. Negative for vomiting, diarrhea, constipation, blood in stool and melena.  Genitourinary: Positive for urgency, frequency and flank pain. Negative for dysuria and hematuria.  Musculoskeletal: Negative for back pain, joint pain and neck pain.  Skin: Negative for itching and rash.  Neurological: Positive for headaches. Negative for dizziness, tingling, sensory change, focal weakness and seizures.  Endo/Heme/Allergies: Does not bruise/bleed easily.  Psychiatric/Behavioral: Positive for depression and substance abuse. The patient is not nervous/anxious.     MEDICATIONS AT HOME:   Prior to Admission medications   Medication Sig Start Date End Date Taking? Authorizing Provider  albuterol (PROVENTIL HFA;VENTOLIN HFA) 108 (90 BASE) MCG/ACT inhaler Inhale 1-2 puffs into the lungs every 6 (six) hours as needed for wheezing or shortness of breath. 04/19/14   Shuvon B Rankin, NP   mirtazapine (REMERON) 15 MG tablet Take 15 mg (1 tablet) at bedtime for Major Depression and Sleep 04/19/14   Shuvon B Rankin, NP  Multiple Vitamin (MULTIVITAMIN WITH MINERALS) TABS tablet Take one tablet daily for nutritional support 04/19/14   Shuvon B Rankin, NP  naproxen (NAPROSYN) 500 MG tablet Take 1 tablet (500 mg total) by mouth 2 (two) times daily as needed for mild pain, moderate pain or headache. 04/19/14   Shuvon B Rankin, NP  risperiDONE (RISPERDAL) 1 MG tablet Take 1 mg (one tablet) at bedtime for mood control and psychosis 04/19/14   Shuvon B Rankin, NP  thiamine 100 MG tablet Take 1 tablet (100 mg total) by mouth daily. Thiamine deficiency 04/19/14   Shuvon B Rankin, NP      VITAL SIGNS:  Blood pressure 114/79, pulse 115, temperature 98.1 F (36.7 C), resp. rate 21, height  (1.499 m), weight 39.463 kg (87 lb), SpO2 96 %.  PHYSICAL EXAMINATION:  Physical Exam  Constitutional: She is oriented to person, place, and time.  Thin built  HENT:  Head: Normocephalic and atraumatic.  Right Ear: External ear normal.  Left Ear: External ear normal.  Nose: Nose normal.  Mouth/Throat: Oropharynx is clear and moist. No oropharyngeal exudate.  Eyes: EOM are normal. Pupils are equal, round, and reactive to light. No scleral icterus.  Neck: Normal range of motion. Neck supple. No JVD present. No thyromegaly present.  Cardiovascular: Regular rhythm, normal heart sounds and intact distal pulses.  Exam reveals no friction rub.   No murmur heard. Tachycardia +  Respiratory: Effort normal. No respiratory distress. She has wheezes. She has no rales. She exhibits no tenderness.  GI: Soft. Bowel sounds are normal. She exhibits no distension and no mass. There is no rebound and no guarding.  Left CVA tenderness +  Musculoskeletal: Normal range of motion. She exhibits no edema.  Lymphadenopathy:    She has no cervical adenopathy.  Neurological: She is alert and oriented to person, place, and  time. She has normal reflexes. She displays normal reflexes. No cranial nerve deficit. She exhibits normal muscle tone.  Skin: Skin is warm. No rash noted. No erythema.  Psychiatric: She has a normal mood and affect. Thought content normal.   LABORATORY PANEL:   CBC  Recent Labs Lab 09/05/14 2212  WBC 9.8  HGB 11.5*  HCT 34.3*  PLT 184   ------------------------------------------------------------------------------------------------------------------  Chemistries   Recent Labs Lab 09/05/14 2212  NA 135  K 3.5  CL 100*  CO2 25  GLUCOSE 111*  BUN 17  CREATININE 1.01*  CALCIUM 8.2*  AST 25  ALT 14  ALKPHOS 69  BILITOT 0.7   ------------------------------------------------------------------------------------------------------------------  Cardiac Enzymes  Recent Labs Lab 09/05/14 2212  TROPONINI 0.03   ------------------------------------------------------------------------------------------------------------------  RADIOLOGY:  Dg Chest 2 View  09/05/2014   CLINICAL DATA:  Cough and substernal chest pain  EXAM: CHEST  2 VIEW  COMPARISON:  04/08/2013  FINDINGS: The heart size and mediastinal contours are within normal limits. Both lungs are clear but hyperinflated. The visualized skeletal structures are unremarkable.  IMPRESSION: COPD without acute abnormality.   Electronically Signed   By: Alcide Clever M.D.   On: 09/05/2014 20:41   Ct Head Wo Contrast  09/05/2014   CLINICAL DATA:  Headache  for 4 days, chest pain, dizziness, abdominal pain, dysuria. History of severe alcohol abuse, polysubstance abuse, bipolar 1 disorder.  EXAM: CT HEAD WITHOUT CONTRAST  TECHNIQUE: Contiguous axial images were obtained from the base of the skull through the vertex without intravenous contrast.  COMPARISON:  CT head November 16, 2012  FINDINGS: The ventricles and sulci are normal. No intraparenchymal hemorrhage, mass effect nor midline shift. No acute large vascular territory infarcts.  No  abnormal extra-axial fluid collections. Basal cisterns are patent.  No skull fracture. The included ocular globes and orbital contents are non-suspicious. The mastoid aircells and included paranasal sinuses are well-aerated. Soft tissue within the LEFT external auditory canal consistent with cerumen.  IMPRESSION: No acute intracranial process; normal noncontrast CT head.   Electronically Signed   By: Awilda Metro M.D.   On: 09/05/2014 23:50   Ct Chest W Contrast  09/05/2014   CLINICAL DATA:  Chest pain, dizziness, abdominal pain. Headache and dysuria.  EXAM: CT CHEST, ABDOMEN, AND PELVIS WITH CONTRAST  TECHNIQUE: Multidetector CT imaging of the chest, abdomen and pelvis was performed following the standard protocol during bolus administration of intravenous contrast.  CONTRAST:  75mL OMNIPAQUE IOHEXOL 300 MG/ML  SOLN  COMPARISON:  None.  FINDINGS: CT CHEST FINDINGS  THORACIC INLET/BODY WALL:  No acute abnormality.  MEDIASTINUM:  Normal heart size. No pericardial effusion. No acute vascular abnormality. No adenopathy.  LUNG WINDOWS:  Centrilobular emphysema which is mild. Mild atelectasis or scarring in the medial segment right middle lobe.  OSSEOUS:  No acute fracture.  No suspicious lytic or blastic lesions.  CT ABDOMEN AND PELVIS FINDINGS  BODY WALL: No contributory findings.  Liver: No focal abnormality.  Biliary: No evidence of biliary obstruction or stone.  Pancreas: Unremarkable.  Spleen: Unremarkable.  Adrenals: Unremarkable.  Kidneys and ureters: There is expansion of the left kidney with patchy hypo enhancement. Asymmetric left perinephric edema and left upper urothelial thickening. No hydronephrosis. No abscess.  Bladder: Unremarkable.  Reproductive: No pathologic findings.  Bowel: No obstruction. Normal appendix.  Retroperitoneum: No mass or adenopathy.  Peritoneum: No ascites or pneumoperitoneum.  Vascular: No acute abnormality. Prominent aortic and branch vessel atherosclerosis for age.   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 No acute osseous findings.  IMPRESSION: 1. Left pyelonephritis without hydronephrosis or abscess. 2. Emphysema. 3. Thoracic and thoracolumbar hemivertebra with scoliosis.   Electronically Signed   By: Marnee Spring M.D.   On: 09/05/2014 23:54   Ct Abdomen Pelvis W Contrast  09/05/2014   CLINICAL DATA:  Chest pain, dizziness, abdominal pain. Headache and dysuria.  EXAM: CT CHEST, ABDOMEN, AND PELVIS WITH CONTRAST  TECHNIQUE: Multidetector CT imaging of the chest, abdomen and pelvis was performed following the standard protocol during bolus administration of intravenous contrast.  CONTRAST:  75mL OMNIPAQUE IOHEXOL 300 MG/ML  SOLN  COMPARISON:  None.  FINDINGS: CT CHEST FINDINGS  THORACIC INLET/BODY WALL:  No acute abnormality.  MEDIASTINUM:  Normal heart size. No pericardial effusion. No acute vascular abnormality. No adenopathy.  LUNG WINDOWS:  Centrilobular emphysema which is mild. Mild atelectasis or scarring in the medial segment right middle lobe.  OSSEOUS:  No acute fracture.  No suspicious lytic or blastic lesions.  CT ABDOMEN AND PELVIS FINDINGS  BODY WALL: No contributory findings.  Liver: No focal abnormality.  Biliary: No evidence of biliary obstruction or stone.  Pancreas: Unremarkable.  Spleen: Unremarkable.  Adrenals: Unremarkable.  Kidneys and ureters: There is expansion of the left  kidney with patchy hypo enhancement. Asymmetric left perinephric edema and left upper urothelial thickening. No hydronephrosis. No abscess.  Bladder: Unremarkable.  Reproductive: No pathologic findings.  Bowel: No obstruction. Normal appendix.  Retroperitoneum: No mass or adenopathy.  Peritoneum: No ascites or pneumoperitoneum.  Vascular: No acute abnormality. Prominent aortic and branch vessel atherosclerosis for age.  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 No acute osseous  findings.  IMPRESSION: 1. Left pyelonephritis without hydronephrosis or abscess. 2. Emphysema. 3. Thoracic and thoracolumbar hemivertebra with scoliosis.   Electronically Signed   By: Marnee Spring M.D.   On: 09/05/2014 23:54    EKG:   Orders placed or performed during the hospital encounter of 09/05/14  . EKG test sinus tachycardia with ventricular rate of 10 6 bpm, nonspecific ST abnormality.   . EKG test    IMPRESSION AND PLAN:   1. Left pyelonephritis. Plan: Admit, IV Levaquin, follow-up urine cultures, CBC. 2. Shortness of breath with wheezing and cough. Patient active smoker. Likely COPD exacerbation, clinically acute bronchitis. Plan:: O2 supplementation, IV Solu-Medrol, DuoNebs, monitor O2 sats, IV Levaquin. 3. Headache. Patient has a history of chronic headaches. CT head negative, neuro exam normal. Tylenol when necessary, monitor. 4. History of bipolar disorder, was on medications in the past, currently not using. Monitor. 5. History of tobacco usage. Counseled to quit. Patient not motivated at this time. 6. History of polysubstance abuse in the past. States she used cocaine 3 days ago. Check UDS. Monitor.    All the records are reviewed and case discussed with ED provider. Management plans discussed with the patient, family and they are in agreement.  CODE STATUS: Full code  TOTAL TIME TAKING CARE OF THIS PATIENT: 50 minutes.    Jonnie Kind N M.D on 09/06/2014 at 1:05 AM  Between 7am to 6pm - Pager - [phone]  After 6pm go to [email or web address] - password [email or web address] [facility]  [facility]  Office  [phone]  CC: Primary care physician; Default, Provider, MD

## 2014-09-06 NOTE — Progress Notes (Signed)
ANTIBIOTIC CONSULT NOTE - INITIAL  Pharmacy Consult for Levaquin Indication: Pyelonephritis  Allergies  Allergen Reactions  . Darvocet [Propoxyphene N-Acetaminophen] Nausea And Vomiting    Patient Measurements: Height: 4\' 11"  (149.9 cm) Weight: 87 lb (39.463 kg) IBW/kg (Calculated) : 43.2 Adjusted Body Weight: n/a  Vital Signs: Temp: 98.2 F (36.8 C) (05/29 0215) Temp Source: Oral (05/29 0215) BP: 92/56 mmHg (05/29 0215) Pulse Rate: 99 (05/29 0215) Intake/Output from previous day:   Intake/Output from this shift:    Labs:  Recent Labs  09/05/14 2212  WBC 9.8  HGB 11.5*  PLT 184  CREATININE 1.01*   Estimated Creatinine Clearance: 40.6 mL/min (by C-G formula based on Cr of 1.01). No results for input(s): VANCOTROUGH, VANCOPEAK, VANCORANDOM, GENTTROUGH, GENTPEAK, GENTRANDOM, TOBRATROUGH, TOBRAPEAK, TOBRARND, AMIKACINPEAK, AMIKACINTROU, AMIKACIN in the last 72 hours.   Microbiology: No results found for this or any previous visit (from the past 720 hour(s)).  Medical History: Past Medical History  Diagnosis Date  . Bronchitis   . Bipolar 1 disorder   . Attention deficit disorder   . ADHD (attention deficit hyperactivity disorder)     Medications:  Anti-infectives    Start     Dose/Rate Route Frequency Ordered Stop   09/05/14 2345  levofloxacin (LEVAQUIN) IVPB 500 mg     500 mg 100 mL/hr over 60 Minutes Intravenous  Once 09/05/14 2338 09/06/14 0105     Assessment: Patient admitted with left pyelonephritis. Initiated on Levaquin in the ED to be continued inpatient.  Goal of Therapy:  Resolution of symptoms  Plan:  Follow up culture results Will continue with Levaquin 250mg  IV q24h.  Clovia CuffLisa Shley Dolby, PharmD, BCPS 09/06/2014 2:41 AM

## 2014-09-06 NOTE — Progress Notes (Signed)
New admit with active UTI. IV abx given q 24 hrs. IV fluids. headache complaint with tylenol and ibuprophen po given .. 2 loose bm's on arrival to floor .sleeping at present.diminished lung sounds dyspnea on exertion . Current smoker.states she would like to stop  Smoking mother recently died of COPD. Also care mgmt referral sent in for assist with home meds.patient noncompliant with meds.

## 2014-09-06 NOTE — Care Management (Signed)
RNCM consult received and will continue to follow. She is listed has being self-pay so arranging home health will be difficult especially if it is for home health. She also has concern placed for "not being able to afford Rx". I attempted to contact patient remotely by phone but there was no answer. This RNCM will follow up with patient on outpatient resources such at [facility] and [facility] referrals. She was recently admitted for alcohol abuse so there may be concern for that too if that is "why" she cannot afford her medications. RNCM will continue to follow.

## 2014-09-06 NOTE — Progress Notes (Signed)
Pt said headache is getting worse, fiorecet ordered

## 2014-09-06 NOTE — Progress Notes (Signed)
Continue to monitor

## 2014-09-06 NOTE — Progress Notes (Signed)
[facility] - [place] at [facility]   PATIENT NAME: Brittany Sanford    MR#:  [id number]  DATE OF BIRTH:  15-Mar-1961  SUBJECTIVE:  Came in back pain and dysuria with sob. Much better today   REVIEW OF SYSTEMS:    Review of Systems  Constitutional: Negative for fever, chills and weight loss.  HENT: Negative for ear discharge, ear pain and nosebleeds.   Eyes: Negative for blurred vision, pain and discharge.  Respiratory: Negative for cough, sputum production, wheezing and stridor.   Cardiovascular: Negative for chest pain, palpitations, orthopnea and PND.  Gastrointestinal: Positive for abdominal pain. Negative for nausea, vomiting and diarrhea.  Genitourinary: Negative for dysuria, urgency and frequency.  Musculoskeletal: Negative for back pain and joint pain.  Neurological: Negative for sensory change, speech change, focal weakness and weakness.  Psychiatric/Behavioral: Negative for depression and hallucinations. The patient is not nervous/anxious.     Tolerating Diet:yes  DRUG ALLERGIES:   Allergies  Allergen Reactions  . Darvocet [Propoxyphene N-Acetaminophen] Nausea And Vomiting    VITALS:  Blood pressure 96/60, pulse 80, temperature 97.6 F (36.4 C), temperature source Oral, resp. rate 18, height  (1.499 m), weight 40.37 kg (89 lb), SpO2 93 %.  PHYSICAL EXAMINATION:   Physical Exam  GENERAL:  54 y.o.-year-old patient lying in the bed with no acute distress. Thin, cachectic EYES: Pupils equal, round, reactive to light and accommodation. No scleral icterus. Extraocular muscles intact.  HEENT: Head atraumatic, normocephalic. Oropharynx and nasopharynx clear.  NECK:  Supple, no jugular venous distention. No thyroid enlargement, no tenderness.  LUNGS: Normal breath sounds bilaterally, no wheezing, rales, rhonchi. No use of accessory muscles of respiration.  CARDIOVASCULAR: S1, S2 normal. No murmurs, rubs, or gallops.  ABDOMEN: Soft, nontender,  nondistended. Bowel sounds present. No organomegaly or mass.  EXTREMITIES: No cyanosis, clubbing or edema b/l.    NEUROLOGIC: Cranial nerves II through XII are intact. No focal Motor or sensory deficits b/l.   PSYCHIATRIC: The patient is alert and oriented x 3.  SKIN: No obvious rash, lesion, or ulcer.    LABORATORY PANEL:   CBC  Recent Labs Lab 09/06/14 0749  WBC 8.1  HGB 9.5*  HCT 27.6*  PLT 153   ------------------------------------------------------------------------------------------------------------------  Chemistries   Recent Labs Lab 09/05/14 2212 09/06/14 0326 09/06/14 0749  NA 135 133*  --   K 3.5 2.9* 3.6  CL 100* 103  --   CO2 25 23  --   GLUCOSE 111* 239*  --   BUN 17 14  --   CREATININE 1.01* 0.96  --   CALCIUM 8.2* 7.3*  --   AST 25  --   --   ALT 14  --   --   ALKPHOS 69  --   --   BILITOT 0.7  --   --    ------------------------------------------------------------------------------------------------------------------  Cardiac Enzymes  Recent Labs Lab 09/05/14 2212  TROPONINI 0.03   ------------------------------------------------------------------------------------------------------------------  RADIOLOGY:  Dg Chest 2 View  09/05/2014   CLINICAL DATA:  Cough and substernal chest pain  EXAM: CHEST  2 VIEW  COMPARISON:  04/08/2013  FINDINGS: The heart size and mediastinal contours are within normal limits. Both lungs are clear but hyperinflated. The visualized skeletal structures are unremarkable.  IMPRESSION: COPD without acute abnormality.   Electronically Signed   By: Alcide Clever M.D.   On: 09/05/2014 20:41   Ct Head Wo Contrast  09/05/2014   CLINICAL DATA:  Headache for 4 days, chest  pain, dizziness, abdominal pain, dysuria. History of severe alcohol abuse, polysubstance abuse, bipolar 1 disorder.  EXAM: CT HEAD WITHOUT CONTRAST  TECHNIQUE: Contiguous axial images were obtained from the base of the skull through the vertex without  intravenous contrast.  COMPARISON:  CT head November 16, 2012  FINDINGS: The ventricles and sulci are normal. No intraparenchymal hemorrhage, mass effect nor midline shift. No acute large vascular territory infarcts.  No abnormal extra-axial fluid collections. Basal cisterns are patent.  No skull fracture. The included ocular globes and orbital contents are non-suspicious. The mastoid aircells and included paranasal sinuses are well-aerated. Soft tissue within the LEFT external auditory canal consistent with cerumen.  IMPRESSION: No acute intracranial process; normal noncontrast CT head.   Electronically Signed   By: Awilda Metro M.D.   On: 09/05/2014 23:50   Ct Chest W Contrast  09/05/2014   CLINICAL DATA:  Chest pain, dizziness, abdominal pain. Headache and dysuria.  EXAM: CT CHEST, ABDOMEN, AND PELVIS WITH CONTRAST  TECHNIQUE: Multidetector CT imaging of the chest, abdomen and pelvis was performed following the standard protocol during bolus administration of intravenous contrast.  CONTRAST:  75mL OMNIPAQUE IOHEXOL 300 MG/ML  SOLN  COMPARISON:  None.  FINDINGS: CT CHEST FINDINGS  THORACIC INLET/BODY WALL:  No acute abnormality.  MEDIASTINUM:  Normal heart size. No pericardial effusion. No acute vascular abnormality. No adenopathy.  LUNG WINDOWS:  Centrilobular emphysema which is mild. Mild atelectasis or scarring in the medial segment right middle lobe.  OSSEOUS:  No acute fracture.  No suspicious lytic or blastic lesions.  CT ABDOMEN AND PELVIS FINDINGS  BODY WALL: No contributory findings.  Liver: No focal abnormality.  Biliary: No evidence of biliary obstruction or stone.  Pancreas: Unremarkable.  Spleen: Unremarkable.  Adrenals: Unremarkable.  Kidneys and ureters: There is expansion of the left kidney with patchy hypo enhancement. Asymmetric left perinephric edema and left upper urothelial thickening. No hydronephrosis. No abscess.  Bladder: Unremarkable.  Reproductive: No pathologic findings.  Bowel: No  obstruction. Normal appendix.  Retroperitoneum: No mass or adenopathy.  Peritoneum: No ascites or pneumoperitoneum.  Vascular: No acute abnormality. Prominent aortic and branch vessel atherosclerosis for age.  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 No acute osseous findings.  IMPRESSION: 1. Left pyelonephritis without hydronephrosis or abscess. 2. Emphysema. 3. Thoracic and thoracolumbar hemivertebra with scoliosis.   Electronically Signed   By: Marnee Spring M.D.   On: 09/05/2014 23:54   Ct Abdomen Pelvis W Contrast  09/05/2014   CLINICAL DATA:  Chest pain, dizziness, abdominal pain. Headache and dysuria.  EXAM: CT CHEST, ABDOMEN, AND PELVIS WITH CONTRAST  TECHNIQUE: Multidetector CT imaging of the chest, abdomen and pelvis was performed following the standard protocol during bolus administration of intravenous contrast.  CONTRAST:  75mL OMNIPAQUE IOHEXOL 300 MG/ML  SOLN  COMPARISON:  None.  FINDINGS: CT CHEST FINDINGS  THORACIC INLET/BODY WALL:  No acute abnormality.  MEDIASTINUM:  Normal heart size. No pericardial effusion. No acute vascular abnormality. No adenopathy.  LUNG WINDOWS:  Centrilobular emphysema which is mild. Mild atelectasis or scarring in the medial segment right middle lobe.  OSSEOUS:  No acute fracture.  No suspicious lytic or blastic lesions.  CT ABDOMEN AND PELVIS FINDINGS  BODY WALL: No contributory findings.  Liver: No focal abnormality.  Biliary: No evidence of biliary obstruction or stone.  Pancreas: Unremarkable.  Spleen: Unremarkable.  Adrenals: Unremarkable.  Kidneys and ureters: There is expansion of the left kidney with patchy hypo  enhancement. Asymmetric left perinephric edema and left upper urothelial thickening. No hydronephrosis. No abscess.  Bladder: Unremarkable.  Reproductive: No pathologic findings.  Bowel: No obstruction. Normal appendix.  Retroperitoneum: No mass or adenopathy.  Peritoneum: No ascites or  pneumoperitoneum.  Vascular: No acute abnormality. Prominent aortic and branch vessel atherosclerosis for age.  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 No acute osseous findings.  IMPRESSION: 1. Left pyelonephritis without hydronephrosis or abscess. 2. Emphysema. 3. Thoracic and thoracolumbar hemivertebra with scoliosis.   Electronically Signed   By: Marnee SpringJonathon  Watts M.D.   On: 09/05/2014 23:54     ASSESSMENT AND PLAN:   1. Left pyelonephritis.  -IV Levaquin, follow-up urine cultures, CBC. 2. Shortness of breath with wheezing and cough. Patient active smoker. Likely COPD exacerbation, O2 supplementation,recvd  IV Solu-Medrol, DuoNebs, monitor O2 sats, IV Levaquin. -sats stable on RA -no indication for more steroid 3. Headache. Patient has a history of chronic headaches. CT head negative, neuro exam normal. Tylenol when necessary, monitor. 4. History of bipolar disorder, was on medications in the past, currently not using. 5. History of tobacco usage. Counseled to quit. Patient not motivated at this time. 6. History of polysubstance abuse in the past. States she used cocaine 3 days ago.     Management plans discussed with the patient and  in agreement.   CODE STATUS: Full  DVT Prophylaxis: Heparin  TOTAL TIME TAKING CARE OF THIS PATIENT:30 minutes.   POSSIBLE D/C IN 1-2 DAYS, DEPENDING ON CLINICAL CONDITION.   Jyl Chico M.D on 09/06/2014 at 1:25 PM  Between 7am to 6pm - Pager - [phone]  After 6pm go to [email or web address] - password [email or web address] [email or web address]  [facility]  Office  [phone]  CC: Primary care physician; Default, Provider, MD

## 2014-09-06 NOTE — Progress Notes (Signed)
MD rounded earlier today and said to discontinue IVF once the current bag of fluid is complete

## 2014-09-06 NOTE — Progress Notes (Signed)
Pt smokes a pack a day. MD ordered nicotine patch

## 2014-09-06 NOTE — Progress Notes (Signed)
Patient complaining of heart burn.  MD ordered Zantac

## 2014-09-07 MED ORDER — LEVOFLOXACIN 250 MG PO TABS: 250.0000 mg | ORAL_TABLET | Freq: Every day | ORAL | Status: DC

## 2014-09-07 NOTE — Progress Notes (Signed)
PTs VSS, verbalized understanding of discharge instructions,prescription called to [facility] by MD. Pt wheeled to lobby.

## 2014-09-07 NOTE — Progress Notes (Addendum)
Patient has no iv fluids .Marland Kitchen. Respirations diminished . nicotene patch on. Benadryl po for rest. Rested well this shift.

## 2014-09-07 NOTE — Discharge Summary (Signed)
[facility] - [place] at [facility]   PATIENT NAME: Brittany FlingLesa Sanford    MR#:  [id number]  DATE OF BIRTH:  1960-08-23  DATE OF ADMISSION:  09/05/2014 ADMITTING PHYSICIAN: Crissie FiguresEdavally N Reddy, MD  DATE OF DISCHARGE: 09/07/2014  PRIMARY CARE PHYSICIAN: Default, Provider, MD    ADMISSION DIAGNOSIS:  Pyelonephritis [N12] Left lower quadrant pain [R10.32] Acute cystitis with hematuria [N30.01] Acute nonintractable headache, unspecified headache type [R51] Pulmonary emphysema, unspecified emphysema type [J43.9]  DISCHARGE DIAGNOSIS:  Left side pyelonephritis  SECONDARY DIAGNOSIS:   Past Medical History  Diagnosis Date  . Bronchitis   . Bipolar 1 disorder   . Attention deficit disorder   . ADHD (attention deficit hyperactivity disorder)     HOSPITAL COURSE:   1. Left pyelonephritis.  -IV Levaquin -no  urine cultures or BC was ordered by ER! 2. Shortness of breath with wheezing and cough. Patient active smoker. Likely COPD exacerbation, O2 supplementation,recvd IV Solu-Medrol, DuoNebs,   Levaquin. -sats stable on RA -no indication for more steroid -pt advised to stop smoking 3. Headache. Patient has a history of chronic headaches. CT head negative, neuro exam normal. Tylenol when necessary 4. History of bipolar disorder, was on medications in the past, currently not using. -she is not taking her risperdal and remeron for several months. Will not give her any rx. 5. History of tobacco usage. Counseled to quit. Patient not motivated at this time. 6. History of polysubstance abuse in the past. States she used cocaine 3 days ago.   Overall stable D/c home DISCHARGE CONDITIONS:   fiar  CONSULTS OBTAINED:    none DRUG ALLERGIES:   Allergies  Allergen Reactions  . Darvocet [Propoxyphene N-Acetaminophen] Nausea And Vomiting    DISCHARGE MEDICATIONS:   Current Discharge Medication List    START taking these medications   Details  levofloxacin  (LEVAQUIN) 250 MG tablet Take 1 tablet (250 mg total) by mouth daily. Qty: 7 tablet, Refills: 0      CONTINUE these medications which have NOT CHANGED   Details  naproxen (NAPROSYN) 500 MG tablet Take 1 tablet (500 mg total) by mouth 2 (two) times daily as needed for mild pain, moderate pain or headache. Qty: 60 tablet, Refills: 0      STOP taking these medications     mirtazapine (REMERON) 15 MG tablet      Multiple Vitamin (MULTIVITAMIN WITH MINERALS) TABS tablet      risperiDONE (RISPERDAL) 1 MG tablet      thiamine 100 MG tablet         If you experience worsening of your admission symptoms, develop shortness of breath, life threatening emergency, suicidal or homicidal thoughts you must seek medical attention immediately by calling 911 or calling your MD immediately  if symptoms less severe.  You Must read complete instructions/literature along with all the possible adverse reactions/side effects for all the Medicines you take and that have been prescribed to you. Take any new Medicines after you have completely understood and accept all the possible adverse reactions/side effects.   Please note  You were cared for by a hospitalist during your hospital stay. If you have any questions about your discharge medications or the care you received while you were in the hospital after you are discharged, you can call the unit and asked to speak with the hospitalist on call if the hospitalist that took care of you is not available. Once you are discharged, your primary care physician will handle any further  medical issues. Please note that NO REFILLS for any discharge medications will be authorized once you are discharged, as it is imperative that you return to your primary care physician (or establish a relationship with a primary care physician if you do not have one) for your aftercare needs so that they can reassess your need for medications and monitor your lab values.    Today    SUBJECTIVE   Doing well. I am ready to go home   VITAL SIGNS:  Blood pressure 135/84, pulse 88, temperature 99 F (37.2 C), temperature source Oral, resp. rate 18, height  (1.499 m), weight 41.232 kg (90 lb 14.4 oz), SpO2 100 %.  I/O:   Intake/Output Summary (Last 24 hours) at 09/07/14 1038 Last data filed at 09/07/14 0900  Gross per 24 hour  Intake   1395 ml  Output      0 ml  Net   1395 ml    PHYSICAL EXAMINATION:  GENERAL:  54 y.o.-year-old patient lying in the bed with no acute distress. Thin EYES: Pupils equal, round, reactive to light and accommodation. No scleral icterus. Extraocular muscles intact.  HEENT: Head atraumatic, normocephalic. Oropharynx and nasopharynx clear.  NECK:  Supple, no jugular venous distention. No thyroid enlargement, no tenderness.  LUNGS: Normal breath sounds bilaterally, no wheezing, rales,rhonchi or crepitation. No use of accessory muscles of respiration.  CARDIOVASCULAR: S1, S2 normal. No murmurs, rubs, or gallops.  ABDOMEN: Soft, non-tender, non-distended. Bowel sounds present. No organomegaly or mass.  EXTREMITIES: No pedal edema, cyanosis, or clubbing.  NEUROLOGIC: Cranial nerves II through XII are intact. Muscle strength 5/5 in all extremities. Sensation intact. Gait not checked.  PSYCHIATRIC: The patient is alert and oriented x 3.  SKIN: No obvious rash, lesion, or ulcer.   DATA REVIEW:   CBC  Recent Labs Lab 09/06/14 0749  WBC 8.1  HGB 9.5*  HCT 27.6*  PLT 153    Chemistries   Recent Labs Lab 09/05/14 2212 09/06/14 0326 09/06/14 0749  NA 135 133*  --   K 3.5 2.9* 3.6  CL 100* 103  --   CO2 25 23  --   GLUCOSE 111* 239*  --   BUN 17 14  --   CREATININE 1.01* 0.96  --   CALCIUM 8.2* 7.3*  --   AST 25  --   --   ALT 14  --   --   ALKPHOS 69  --   --   BILITOT 0.7  --   --     Cardiac Enzymes  Recent Labs Lab 09/05/14 2212  TROPONINI 0.03    Microbiology Results   No results found for this or  any previous visit (from the past 240 hour(s)).  RADIOLOGY:  Dg Chest 2 View  09/05/2014   CLINICAL DATA:  Cough and substernal chest pain  EXAM: CHEST  2 VIEW  COMPARISON:  04/08/2013  FINDINGS: The heart size and mediastinal contours are within normal limits. Both lungs are clear but hyperinflated. The visualized skeletal structures are unremarkable.  IMPRESSION: COPD without acute abnormality.   Electronically Signed   By: Alcide Clever M.D.   On: 09/05/2014 20:41   Ct Head Wo Contrast  09/05/2014   CLINICAL DATA:  Headache for 4 days, chest pain, dizziness, abdominal pain, dysuria. History of severe alcohol abuse, polysubstance abuse, bipolar 1 disorder.  EXAM: CT HEAD WITHOUT CONTRAST  TECHNIQUE: Contiguous axial images were obtained from the base of the skull through the vertex  without intravenous contrast.  COMPARISON:  CT head November 16, 2012  FINDINGS: The ventricles and sulci are normal. No intraparenchymal hemorrhage, mass effect nor midline shift. No acute large vascular territory infarcts.  No abnormal extra-axial fluid collections. Basal cisterns are patent.  No skull fracture. The included ocular globes and orbital contents are non-suspicious. The mastoid aircells and included paranasal sinuses are well-aerated. Soft tissue within the LEFT external auditory canal consistent with cerumen.  IMPRESSION: No acute intracranial process; normal noncontrast CT head.   Electronically Signed   By: Awilda Metro M.D.   On: 09/05/2014 23:50   Ct Chest W Contrast  09/05/2014   CLINICAL DATA:  Chest pain, dizziness, abdominal pain. Headache and dysuria.  EXAM: CT CHEST, ABDOMEN, AND PELVIS WITH CONTRAST  TECHNIQUE: Multidetector CT imaging of the chest, abdomen and pelvis was performed following the standard protocol during bolus administration of intravenous contrast.  CONTRAST:  75mL OMNIPAQUE IOHEXOL 300 MG/ML  SOLN  COMPARISON:  None.  FINDINGS: CT CHEST FINDINGS  THORACIC INLET/BODY WALL:  No acute  abnormality.  MEDIASTINUM:  Normal heart size. No pericardial effusion. No acute vascular abnormality. No adenopathy.  LUNG WINDOWS:  Centrilobular emphysema which is mild. Mild atelectasis or scarring in the medial segment right middle lobe.  OSSEOUS:  No acute fracture.  No suspicious lytic or blastic lesions.  CT ABDOMEN AND PELVIS FINDINGS  BODY WALL: No contributory findings.  Liver: No focal abnormality.  Biliary: No evidence of biliary obstruction or stone.  Pancreas: Unremarkable.  Spleen: Unremarkable.  Adrenals: Unremarkable.  Kidneys and ureters: There is expansion of the left kidney with patchy hypo enhancement. Asymmetric left perinephric edema and left upper urothelial thickening. No hydronephrosis. No abscess.  Bladder: Unremarkable.  Reproductive: No pathologic findings.  Bowel: No obstruction. Normal appendix.  Retroperitoneum: No mass or adenopathy.  Peritoneum: No ascites or pneumoperitoneum.  Vascular: No acute abnormality. Prominent aortic and branch vessel atherosclerosis for age.  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 No acute osseous findings.  IMPRESSION: 1. Left pyelonephritis without hydronephrosis or abscess. 2. Emphysema. 3. Thoracic and thoracolumbar hemivertebra with scoliosis.   Electronically Signed   By: Marnee Spring M.D.   On: 09/05/2014 23:54   Ct Abdomen Pelvis W Contrast  09/05/2014   CLINICAL DATA:  Chest pain, dizziness, abdominal pain. Headache and dysuria.  EXAM: CT CHEST, ABDOMEN, AND PELVIS WITH CONTRAST  TECHNIQUE: Multidetector CT imaging of the chest, abdomen and pelvis was performed following the standard protocol during bolus administration of intravenous contrast.  CONTRAST:  75mL OMNIPAQUE IOHEXOL 300 MG/ML  SOLN  COMPARISON:  None.  FINDINGS: CT CHEST FINDINGS  THORACIC INLET/BODY WALL:  No acute abnormality.  MEDIASTINUM:  Normal heart size. No pericardial effusion. No acute vascular abnormality. No  adenopathy.  LUNG WINDOWS:  Centrilobular emphysema which is mild. Mild atelectasis or scarring in the medial segment right middle lobe.  OSSEOUS:  No acute fracture.  No suspicious lytic or blastic lesions.  CT ABDOMEN AND PELVIS FINDINGS  BODY WALL: No contributory findings.  Liver: No focal abnormality.  Biliary: No evidence of biliary obstruction or stone.  Pancreas: Unremarkable.  Spleen: Unremarkable.  Adrenals: Unremarkable.  Kidneys and ureters: There is expansion of the left kidney with patchy hypo enhancement. Asymmetric left perinephric edema and left upper urothelial thickening. No hydronephrosis. No abscess.  Bladder: Unremarkable.  Reproductive: No pathologic findings.  Bowel: No obstruction. Normal appendix.  Retroperitoneum: No mass or adenopathy.  Peritoneum: No  ascites or pneumoperitoneum.  Vascular: No acute abnormality. Prominent aortic and branch vessel atherosclerosis for age.  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 No acute osseous findings.  IMPRESSION: 1. Left pyelonephritis without hydronephrosis or abscess. 2. Emphysema. 3. Thoracic and thoracolumbar hemivertebra with scoliosis.   Electronically Signed   By: Marnee Spring M.D.   On: 09/05/2014 23:54       Management plans discussed with the patient, family and they are in agreement.  CODE STATUS:     Code Status Orders        Start     Ordered   09/06/14 0223  Full code   Continuous     09/06/14 0222      TOTAL TIME TAKING CARE OF THIS PATIENT:40 minutes.    Mehar Kirkwood M.D on 09/07/2014 at 10:38 AM  Between 7am to 6pm - Pager - [phone] After 6pm go to [email or web address] - password [email or web address] [facility]  [facility]  Office  [phone]  CC: Primary care physician; Default, Provider, MD

## 2014-09-07 NOTE — Discharge Instructions (Signed)
No smoking.
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
## 2014-09-07 NOTE — Care Management Note (Addendum)
Case Management Note  Patient Details  Name: Roselyn MeierLesa G Gappa MRN: [id number] Date of Birth: Feb 21, 1961  Subjective/Objective:                   Patient ambulating independently wanting "to have a bowel movement". She believes she going home today. She states she drives and has a car. She states she lives with a Hispanic female that "lets her stay with him and she pays him with her assistance on his bread route". She [place] in [place] and states "all the bills are in my name". She has no PCP. She has no health insurance.  Action/Plan: Application to [facility]; referral emailed to Lanelle Balracy Salsbury at [facility]. Application to Medication Management also delivered to patient. Medication Management is closed today due to the holiday. If patient needs assistance with medications she will not be able to obtain until Tuesday when they are open; patient understands. Patient contact numbers are [phone] or [phone]. She state she will be taking her Rx [facility].   Expected Discharge Date:  09/07/14               Expected Discharge Plan:     In-House Referral:  Clinical Social Work  Discharge planning Services  CM Consult  Post Acute Care Choice:    Choice offered to:  Patient  DME Arranged:    DME Agency:     HH Arranged:    HH Agency:     Status of Service:     Medicare Important Message Given:    Date Medicare IM Given:    Medicare IM give by:    Date Additional Medicare IM Given:    Additional Medicare Important Message give by:     If discussed at Long Length of Stay Meetings, dates discussed:    Additional Comments:  Collie Siadngela Adonnis Salceda, RN 09/07/2014, 10:19 AM

## 2015-01-04 ENCOUNTER — Encounter: Payer: Self-pay | Admitting: Emergency Medicine

## 2015-01-04 ENCOUNTER — Emergency Department
Admission: EM | Admit: 2015-01-04 | Discharge: 2015-01-04 | Disposition: A | Discharge status: Home / Self Care | Payer: Medicaid Other | Attending: Emergency Medicine | Admitting: Emergency Medicine

## 2015-01-04 DIAGNOSIS — Z720 Tobacco use: Secondary | ICD-10-CM | POA: Insufficient documentation

## 2015-01-04 DIAGNOSIS — F1020 Alcohol dependence, uncomplicated: Secondary | ICD-10-CM | POA: Insufficient documentation

## 2015-01-04 DIAGNOSIS — F1910 Other psychoactive substance abuse, uncomplicated: Secondary | ICD-10-CM

## 2015-01-04 DIAGNOSIS — F1410 Cocaine abuse, uncomplicated: Secondary | ICD-10-CM | POA: Insufficient documentation

## 2015-01-04 LAB — COMPREHENSIVE METABOLIC PANEL
ALK PHOS: 72 U/L (ref 38–126)
ALT: 15 U/L (ref 14–54)
AST: 29 U/L (ref 15–41)
Albumin: 4.2 g/dL (ref 3.5–5.0)
Anion gap: 7 (ref 5–15)
BILIRUBIN TOTAL: 0.3 mg/dL (ref 0.3–1.2)
BUN: 10 mg/dL (ref 6–20)
CALCIUM: 9 mg/dL (ref 8.9–10.3)
CO2: 25 mmol/L (ref 22–32)
Chloride: 101 mmol/L (ref 101–111)
Creatinine, Ser: 0.68 mg/dL (ref 0.44–1.00)
GFR calc Af Amer: >60 mL/min (ref >60)
GFR calc non Af Amer: >60 mL/min (ref >60)
GLUCOSE: 81 mg/dL (ref 65–99)
POTASSIUM: 3.9 mmol/L (ref 3.5–5.1)
SODIUM: 133 mmol/L — AB (ref 135–145)
TOTAL PROTEIN: 7.4 g/dL (ref 6.5–8.1)

## 2015-01-04 LAB — CBC
HCT: 38 % (ref 35.0–47.0)
HEMOGLOBIN: 13.1 g/dL (ref 12.0–16.0)
MCH: 29.3 pg (ref 26.0–34.0)
MCHC: 34.4 g/dL (ref 32.0–36.0)
MCV: 85 fL (ref 80.0–100.0)
Platelets: 256 10*3/uL (ref 150–440)
RBC: 4.47 MIL/uL (ref 3.80–5.20)
RDW: 14.1 % (ref 11.5–14.5)
WBC: 4.8 10*3/uL (ref 3.6–11.0)

## 2015-01-04 LAB — ETHANOL: Alcohol, Ethyl (B): <5 mg/dL (ref <5)

## 2015-01-04 LAB — URINE DRUG SCREEN, QUALITATIVE (ARMC ONLY)
Amphetamines, Ur Screen: NOT DETECTED
BARBITURATES, UR SCREEN: NOT DETECTED
Benzodiazepine, Ur Scrn: NOT DETECTED
CANNABINOID 50 NG, UR ~~LOC~~: NOT DETECTED
COCAINE METABOLITE, UR ~~LOC~~: POSITIVE — AB
MDMA (ECSTASY) UR SCREEN: NOT DETECTED
Methadone Scn, Ur: NOT DETECTED
Opiate, Ur Screen: NOT DETECTED
PHENCYCLIDINE (PCP) UR S: NOT DETECTED
Tricyclic, Ur Screen: NOT DETECTED

## 2015-01-04 MED ORDER — THIAMINE HCL 100 MG/ML IJ SOLN: 100.0000 mg | Freq: Every day | INTRAMUSCULAR | Status: DC

## 2015-01-04 MED ORDER — GABAPENTIN 300 MG PO CAPS
ORAL_CAPSULE | ORAL | Status: AC
  Filled 2015-01-04: qty 1

## 2015-01-04 MED ORDER — LORAZEPAM 2 MG/ML IJ SOLN: 0.0000 mg | Freq: Two times a day (BID) | INTRAMUSCULAR | Status: DC

## 2015-01-04 MED ORDER — LORAZEPAM 2 MG/ML IJ SOLN: 0.0000 mg | Freq: Four times a day (QID) | INTRAMUSCULAR | Status: DC

## 2015-01-04 MED ORDER — VITAMIN B-1 100 MG PO TABS: 100.0000 mg | ORAL_TABLET | Freq: Every day | ORAL | Status: DC

## 2015-01-04 MED ORDER — LORAZEPAM 2 MG PO TABS: 0.0000 mg | ORAL_TABLET | Freq: Two times a day (BID) | ORAL | Status: DC

## 2015-01-04 MED ORDER — LORAZEPAM 2 MG PO TABS: 0.0000 mg | ORAL_TABLET | Freq: Four times a day (QID) | ORAL | Status: DC

## 2015-01-04 MED ORDER — GABAPENTIN 100 MG PO CAPS
ORAL_CAPSULE | ORAL | Status: AC
  Filled 2015-01-04: qty 1

## 2015-01-04 NOTE — ED Notes (Signed)
Pt here voluntary today with request to detox from cocaine and alcohol. Pt has had detox treatment in the past.

## 2015-01-04 NOTE — ED Provider Notes (Addendum)
[facility] Emergency Department Provider Note REMINDER - THIS NOTE IS NOT A FINAL MEDICAL RECORD UNTIL IT IS SIGNED. UNTIL THEN, THE CONTENT BELOW MAY REFLECT INFORMATION FROM A DOCUMENTATION TEMPLATE, NOT THE ACTUAL PATIENT VISIT. ____________________________________________  Time seen: Approximately 9:48 AM  I have reviewed the triage vital signs and the nursing notes.   HISTORY  Chief Complaint Drug / Alcohol Assessment    HPI Brittany Sanford is a 54 y.o. female who presents today with request for detox from alcohol and cocaine. She reports that she last used cocaine about 3 AM, and it for about the last 2 weeks she's been drinking regularly. She states that she had a history of withdrawals or been severe previously, but she is asked units a severe withdrawals today. She drank last evening. She reports that she needs to clean up her life and except some responsibilities after her mother's passing about a year ago. She denies any thoughts of harming herself, she does not want harming also, he does not have hallucinations, and she is not currently having any tremors or withdrawal symptoms. She does report that she has a previous history of bipolar disorder. She denies any history of alcohol withdrawal seizures.   Past Medical History  Diagnosis Date  . Bronchitis   . Bipolar 1 disorder   . Attention deficit disorder   . ADHD (attention deficit hyperactivity disorder)     Patient Active Problem List   Diagnosis Date Noted  . Pyelonephritis 09/06/2014  . SOB (shortness of breath) 09/06/2014  . History of bipolar disorder 04/15/2014  . Alcohol use disorder, severe, dependence 04/14/2014  . Bipolar affective disorder 04/14/2014  . Substance induced mood disorder 04/14/2014  . Alcohol abuse   . Polysubstance abuse     History reviewed. No pertinent past surgical history.  Current Outpatient Rx  Name  Route  Sig  Dispense  Refill  . levofloxacin  (LEVAQUIN) 250 MG tablet   Oral   Take 1 tablet (250 mg total) by mouth daily.   7 tablet   0   . naproxen (NAPROSYN) 500 MG tablet   Oral   Take 1 tablet (500 mg total) by mouth 2 (two) times daily as needed for mild pain, moderate pain or headache.   60 tablet   0     Allergies Darvocet  Family History  Problem Relation Age of Onset  . COPD Mother   . Cancer Father     Social History Social History  Substance Use Topics  . Smoking status: Current Every Day Smoker -- 1.00 packs/day  . Smokeless tobacco: Never Used  . Alcohol Use: Yes    Review of Systems Constitutional: No fever/chills Eyes: No visual changes. ENT: No sore throat. Cardiovascular: Denies chest pain. Respiratory: Denies shortness of breath. Gastrointestinal: No abdominal pain.  No nausea, no vomiting.  No diarrhea.  No constipation. Genitourinary: Negative for dysuria. Musculoskeletal: Negative for back pain. Skin: Negative for rash. Neurological: Negative for headaches, focal weakness or numbness.  10-point ROS otherwise negative.  ____________________________________________   PHYSICAL EXAM:  VITAL SIGNS: ED Triage Vitals  Enc Vitals Group     BP 01/04/15 0833 130/80 mmHg     Pulse Rate 01/04/15 0833 80     Resp 01/04/15 0833 18     Temp 01/04/15 0833 97.7 F (36.5 C)     Temp Source 01/04/15 0833 Oral     SpO2 01/04/15 0833 96 %     Weight 01/04/15 0833  85 lb (38.556 kg)     Height 01/04/15 0833  (1.499 m)     Head Cir --      Peak Flow --      Pain Score --      Pain Loc --      Pain Edu? --      Excl. in GC? --    Constitutional: Alert and oriented. Somewhat underweight. No distress, and amicable Eyes: Conjunctivae are normal. PERRL. EOMI. Head: Atraumatic. Nose: No congestion/rhinnorhea. Mouth/Throat: Mucous membranes are moist.  Oropharynx non-erythematous. Neck: No stridor.   Cardiovascular: Normal rate, regular rhythm. Grossly normal heart sounds.  Good  peripheral circulation. Respiratory: Normal respiratory effort.  No retractions. Lungs CTAB. Gastrointestinal: Soft and nontender. No distention. No abdominal bruits. No CVA tenderness. Musculoskeletal: No lower extremity tenderness nor edema.  No joint effusions. Neurologic:  Normal speech and language. No gross focal neurologic deficits are appreciated. No gait instability. Skin:  Skin is warm, dry and intact. No rash noted. Psychiatric: Mood and affect are normal. Speech and behavior are normal.  ____________________________________________   LABS (all labs ordered are listed, but only abnormal results are displayed)  Labs Reviewed  COMPREHENSIVE METABOLIC PANEL - Abnormal; Notable for the following:    Sodium 133 (*)    All other components within normal limits  ETHANOL  CBC  URINE DRUG SCREEN, QUALITATIVE ([facility] ONLY)   ____________________________________________  EKG   ____________________________________________  RADIOLOGY   ____________________________________________   PROCEDURES  Procedure(s) performed: None  Critical Care performed: No  ____________________________________________   INITIAL IMPRESSION / ASSESSMENT AND PLAN / ED COURSE  Pertinent labs & imaging results that were available during my care of the patient were reviewed by me and considered in my medical decision making (see chart for details).  Patient presents with request for detox. She has no acute medical complaint and no evidence of withdrawal at this time. I have requested a TTS evaluation for detox. She has no evidence of comp located withdrawal, activity withdrawal, or acute psychiatric concerns.  ----------------------------------------- 12:18 PM on 01/04/2015 -----------------------------------------   Not including placing the patient on withdrawal treatment, the patient is now medically clear for referral to detox. ____________________________________________   FINAL  CLINICAL IMPRESSION(S) / ED DIAGNOSES  Final diagnoses:  Polysubstance abuse      Sharyn Creamer, MD 01/04/15 1218  ----------------------------------------- 3:43 PM on 01/04/2015 -----------------------------------------  Patient not able to be placed into outpatient detox because of an issue regarding the patient having a court date and not being able to be excepted for that reason. The patient is given Marga Hoots stable, presently showing no evidence of withdrawal. She is not homicidal or suicidal, and I feel she is appropriate for outpatient follow-up given inability to place for outpatient detox at [facility]. We'll discharge the patient to follow up with [facility]. Patient agreeable with the plan. Advised patient no driving, she will not drive when using substances including alcohol.  Sharyn Creamer, MD 01/04/15 [phone]

## 2015-01-04 NOTE — ED Notes (Signed)
Pt reports wanting to detox from crack and etoh. Pt denies SI, HI

## 2015-01-04 NOTE — Discharge Instructions (Signed)
Polysubstance Abuse °When people abuse more than one drug or type of drug it is called polysubstance or polydrug abuse. For example, many smokers also drink alcohol. This is one form of polydrug abuse. Polydrug abuse also refers to the use of a drug to counteract an unpleasant effect produced by another drug. It may also be used to help with withdrawal from another drug. People who take stimulants may become agitated. Sometimes this agitation is countered with a tranquilizer. This helps protect against the unpleasant side effects. Polydrug abuse also refers to the use of different drugs at the same time.  °Anytime drug use is interfering with normal living activities, it has become abuse. This includes problems with family and friends. Psychological dependence has developed when your mind tells you that the drug is needed. This is usually followed by physical dependence which has developed when continuing increases of drug are required to get the same feeling or "high". This is known as addiction or chemical dependency. A person's risk is much higher if there is a history of chemical dependency in the family. °SIGNS OF CHEMICAL DEPENDENCY °· You have been told by friends or family that drugs have become a problem. °· You fight when using drugs. °· You are having blackouts (not remembering what you do while using). °· You feel sick from using drugs but continue using. °· You lie about use or amounts of drugs (chemicals) used. °· You need chemicals to get you going. °· You are suffering in work performance or in school because of drug use. °· You get sick from use of drugs but continue to use anyway. °· You need drugs to relate to people or feel comfortable in social situations. °· You use drugs to forget problems. °"Yes" answered to any of the above signs of chemical dependency indicates there are problems. The longer the use of drugs continues, the greater the problems will become. °If there is a family history of  drug or alcohol use, it is best not to experiment with these drugs. Continual use leads to tolerance. After tolerance develops more of the drug is needed to get the same feeling. This is followed by addiction. With addiction, drugs become the most important part of life. It becomes more important to take drugs than participate in the other usual activities of life. This includes relating to friends and family. Addiction is followed by dependency. Dependency is a condition where drugs are now needed not just to get high, but to feel normal. °Addiction cannot be cured but it can be stopped. This often requires outside help and the care of professionals. Treatment centers are listed in the yellow pages under: Cocaine, Narcotics, and Alcoholics Anonymous. Most hospitals and clinics can refer you to a specialized care center. Talk to your caregiver if you need help. °Document Released: 11/16/2004 Document Revised: 06/19/2011 Document Reviewed: 03/27/2005 °ExitCare® Patient Information ©2015 [employer]. This information is not intended to replace advice given to you by your health care provider. Make sure you discuss any questions you have with your health care provider. ° °

## 2015-01-04 NOTE — [facility] Counselor (Signed)
Writer spoke with Pt regarding detox request. Pt reported no risk of harm. Writer consulted with EDP DR.Quale and Pt is to be referred to [facility] for detox. Writer verified [facility]) bed availability and will commence referral process.

## 2015-01-11 DIAGNOSIS — B9689 Other specified bacterial agents as the cause of diseases classified elsewhere: Secondary | ICD-10-CM | POA: Insufficient documentation

## 2015-01-11 DIAGNOSIS — F1490 Cocaine use, unspecified, uncomplicated: Secondary | ICD-10-CM | POA: Insufficient documentation

## 2015-01-11 DIAGNOSIS — D72810 Lymphocytopenia: Secondary | ICD-10-CM | POA: Insufficient documentation

## 2015-01-11 DIAGNOSIS — Z720 Tobacco use: Secondary | ICD-10-CM | POA: Insufficient documentation

## 2015-01-11 DIAGNOSIS — B9620 Unspecified Escherichia coli [E. coli] as the cause of diseases classified elsewhere: Secondary | ICD-10-CM | POA: Insufficient documentation

## 2015-02-03 ENCOUNTER — Encounter (HOSPITAL_BASED_OUTPATIENT_CLINIC_OR_DEPARTMENT_OTHER): Payer: Self-pay | Admitting: Emergency Medicine

## 2015-02-03 ENCOUNTER — Emergency Department (HOSPITAL_BASED_OUTPATIENT_CLINIC_OR_DEPARTMENT_OTHER)
Admission: EM | Admit: 2015-02-03 | Discharge: 2015-02-03 | Disposition: A | Discharge status: Home / Self Care | Payer: Medicaid Other | Attending: Emergency Medicine | Admitting: Emergency Medicine

## 2015-02-03 DIAGNOSIS — R059 Cough, unspecified: Secondary | ICD-10-CM

## 2015-02-03 DIAGNOSIS — Z79899 Other long term (current) drug therapy: Secondary | ICD-10-CM | POA: Insufficient documentation

## 2015-02-03 DIAGNOSIS — Z87448 Personal history of other diseases of urinary system: Secondary | ICD-10-CM | POA: Insufficient documentation

## 2015-02-03 DIAGNOSIS — R1030 Lower abdominal pain, unspecified: Secondary | ICD-10-CM | POA: Insufficient documentation

## 2015-02-03 DIAGNOSIS — R05 Cough: Secondary | ICD-10-CM | POA: Insufficient documentation

## 2015-02-03 DIAGNOSIS — Z87891 Personal history of nicotine dependence: Secondary | ICD-10-CM | POA: Insufficient documentation

## 2015-02-03 DIAGNOSIS — Z8659 Personal history of other mental and behavioral disorders: Secondary | ICD-10-CM | POA: Insufficient documentation

## 2015-02-03 DIAGNOSIS — R102 Pelvic and perineal pain: Secondary | ICD-10-CM | POA: Insufficient documentation

## 2015-02-03 DIAGNOSIS — N898 Other specified noninflammatory disorders of vagina: Secondary | ICD-10-CM | POA: Insufficient documentation

## 2015-02-03 DIAGNOSIS — Z8709 Personal history of other diseases of the respiratory system: Secondary | ICD-10-CM | POA: Insufficient documentation

## 2015-02-03 HISTORY — DX: Other psychoactive substance abuse, uncomplicated: F19.10

## 2015-02-03 LAB — COMPREHENSIVE METABOLIC PANEL
ALK PHOS: 80 U/L (ref 38–126)
ALT: 26 U/L (ref 14–54)
AST: 33 U/L (ref 15–41)
Albumin: 3.8 g/dL (ref 3.5–5.0)
Anion gap: 5 (ref 5–15)
BILIRUBIN TOTAL: 0.5 mg/dL (ref 0.3–1.2)
BUN: 22 mg/dL — AB (ref 6–20)
CALCIUM: 9 mg/dL (ref 8.9–10.3)
CO2: 29 mmol/L (ref 22–32)
CREATININE: 0.62 mg/dL (ref 0.44–1.00)
Chloride: 103 mmol/L (ref 101–111)
GFR calc Af Amer: >60 mL/min (ref >60)
Glucose, Bld: 84 mg/dL (ref 65–99)
POTASSIUM: 4.6 mmol/L (ref 3.5–5.1)
Sodium: 137 mmol/L (ref 135–145)
TOTAL PROTEIN: 7 g/dL (ref 6.5–8.1)

## 2015-02-03 LAB — CBC WITH DIFFERENTIAL/PLATELET
BASOS ABS: 0 10*3/uL (ref 0.0–0.1)
Basophils Relative: 1 %
Eosinophils Absolute: 0.1 10*3/uL (ref 0.0–0.7)
Eosinophils Relative: 2 %
HEMATOCRIT: 32.9 % — AB (ref 36.0–46.0)
Hemoglobin: 10.7 g/dL — ABNORMAL LOW (ref 12.0–15.0)
LYMPHS PCT: 22 %
Lymphs Abs: 1.4 10*3/uL (ref 0.7–4.0)
MCH: 28.9 pg (ref 26.0–34.0)
MCHC: 32.5 g/dL (ref 30.0–36.0)
MCV: 88.9 fL (ref 78.0–100.0)
MONO ABS: 0.7 10*3/uL (ref 0.1–1.0)
MONOS PCT: 11 %
NEUTROS ABS: 4.3 10*3/uL (ref 1.7–7.7)
Neutrophils Relative %: 64 %
Platelets: 229 10*3/uL (ref 150–400)
RBC: 3.7 MIL/uL — ABNORMAL LOW (ref 3.87–5.11)
RDW: 13.6 % (ref 11.5–15.5)
WBC: 6.6 10*3/uL (ref 4.0–10.5)

## 2015-02-03 LAB — URINALYSIS, ROUTINE W REFLEX MICROSCOPIC
Bilirubin Urine: NEGATIVE
GLUCOSE, UA: NEGATIVE mg/dL
HGB URINE DIPSTICK: NEGATIVE
KETONES UR: NEGATIVE mg/dL
Leukocytes, UA: NEGATIVE
Nitrite: NEGATIVE
PROTEIN: NEGATIVE mg/dL
Specific Gravity, Urine: 1.011 (ref 1.005–1.030)
UROBILINOGEN UA: 0.2 mg/dL (ref 0.0–1.0)
pH: 5.5 (ref 5.0–8.0)

## 2015-02-03 LAB — WET PREP, GENITAL
Clue Cells Wet Prep HPF POC: NONE SEEN
Trich, Wet Prep: NONE SEEN
YEAST WET PREP: NONE SEEN

## 2015-02-03 MED ORDER — ALBUTEROL SULFATE HFA 108 (90 BASE) MCG/ACT IN AERS
2.0000 | INHALATION_SPRAY | Freq: Once | RESPIRATORY_TRACT | Status: AC
  Administered 2015-02-03: 2 via RESPIRATORY_TRACT
  Filled 2015-02-03: qty 6.7

## 2015-02-03 MED ORDER — NAPROXEN 500 MG PO TABS: 500.0000 mg | ORAL_TABLET | Freq: Two times a day (BID) | ORAL | Status: DC

## 2015-02-03 NOTE — ED Provider Notes (Signed)
CSN: [id number]     Arrival date [id number]& time 02/03/15  1143 History   First MD Initiated Contact with Patient 02/03/15 1257     Chief Complaint  Patient presents with  . Abdominal Pain  . [facility] pt      (Consider location/radiation/quality/duration/timing/severity/associated sxs/prior Treatment) HPI Comments: Patient from [facility] -- presents with complaint of dark urine, bilateral lower abdominal pain with radiation to her back. She has had a previous kidney infection that spread to her bloodstream and is afraid that this is occurring again. She's not had any dysuria, frequency, or urgency. She denies fever, nausea, vomiting, diarrhea. She complains of a slight vaginal discharge, no vaginal bleeding. No treatments prior to arrival. She has had a worsening cough since she quit smoking with mucus production and occasional wheezing. Onset acute. Course is constant. Nothing makes symptoms better or worse.  The history is provided by the patient.    Past Medical History  Diagnosis Date  . Bronchitis   . Bipolar 1 disorder (HCC)   . Attention deficit disorder   . ADHD (attention deficit hyperactivity disorder)   . Substance abuse    History reviewed. No pertinent past surgical history. Family History  Problem Relation Age of Onset  . COPD Mother   . Cancer Father    Social History  Substance Use Topics  . Smoking status: Former Smoker -- 1.00 packs/day    Types: Cigarettes    Quit date: 01/22/2015  . Smokeless tobacco: Never Used  . Alcohol Use: Yes   OB History    Gravida Para Term Preterm AB TAB SAB Ectopic Multiple Living   2 2             Review of Systems  Constitutional: Negative for fever.  HENT: Negative for rhinorrhea and sore throat.   Eyes: Negative for redness.  Respiratory: Negative for cough.   Cardiovascular: Negative for chest pain.  Gastrointestinal: Positive for abdominal pain. Negative for nausea, vomiting and diarrhea.  Genitourinary: Positive for vaginal  discharge and pelvic pain. Negative for dysuria, urgency, frequency and vaginal bleeding.  Musculoskeletal: Negative for myalgias.  Skin: Negative for rash.  Neurological: Negative for headaches.    Allergies  Darvocet  Home Medications   Prior to Admission medications   Medication Sig Start Date End Date Taking? Authorizing Provider  ibuprofen (ADVIL,MOTRIN) 200 MG tablet Take 200 mg by mouth every 6 (six) hours as needed.   Yes Historical Provider, MD  nicotine (NICODERM CQ - DOSED IN MG/24 HOURS) 21 mg/24hr patch Place 21 mg onto the skin daily.   Yes Historical Provider, MD   BP 127/83 mmHg  Pulse 87  Temp(Src) 97.7 F (36.5 C) (Oral)  Resp 20  Ht 4\' 11"  (1.499 m)  Wt 82 lb (37.195 kg)  BMI 16.55 kg/m2  SpO2 100% Physical Exam  Constitutional: She appears well-developed and well-nourished.  HENT:  Head: Normocephalic and atraumatic.  Mouth/Throat: Oropharynx is clear and moist.  Eyes: Conjunctivae are normal. Right eye exhibits no discharge. Left eye exhibits no discharge.  Neck: Normal range of motion. Neck supple.  Cardiovascular: Normal rate, regular rhythm and normal heart sounds.   Pulmonary/Chest: Effort normal. No respiratory distress. She has no wheezes. She has rhonchi (Scattered). She has no rales.  Abdominal: Soft. There is no tenderness.  Genitourinary: There is no rash or tenderness on the right labia. There is no rash or tenderness on the left labia. Uterus is not tender. Cervix exhibits no motion tenderness and no  discharge. Right adnexum displays tenderness (mild). Right adnexum displays no mass. Left adnexum displays tenderness (mild). Left adnexum displays no mass. No tenderness in the vagina. No vaginal discharge found.  Neurological: She is alert.  Skin: Skin is warm and dry.  Psychiatric: She has a normal mood and affect.  Nursing note and vitals reviewed.   ED Course  Procedures (including critical care time) Labs Review Labs Reviewed  WET PREP,  GENITAL - Abnormal; Notable for the following:    WBC, Wet Prep HPF POC FEW (*)    All other components within normal limits  CBC WITH DIFFERENTIAL/PLATELET - Abnormal; Notable for the following:    RBC 3.70 (*)    Hemoglobin 10.7 (*)    HCT 32.9 (*)    All other components within normal limits  COMPREHENSIVE METABOLIC PANEL - Abnormal; Notable for the following:    BUN 22 (*)    All other components within normal limits  URINALYSIS, ROUTINE W REFLEX MICROSCOPIC (NOT AT [facility])  GC/CHLAMYDIA PROBE AMP ([place]) NOT AT [facility]    Imaging Review No results found. I have personally reviewed and evaluated these images and lab results as part of my medical decision-making.   EKG Interpretation None       1:35 PM Patient seen and examined. Work-up initiated. Medications ordered.   Vital signs reviewed and are as follows: BP 127/83 mmHg  Pulse 87  Temp(Src) 97.7 F (36.5 C) (Oral)  Resp 20  Ht  (1.499 m)  Wt 82 lb (37.195 kg)  BMI 16.55 kg/m2  SpO2 100%  1:52 PM Pelvic exam performed with NT chaperone.   2:45 PM Patient updated on results. She is comfortable. Will discharge to home. NSAIDs as needed for lower abdominal pain. GYN follow-up if this persists. Also will give albuterol inhaler for wheezing and cough.  Patient counseled on use of albuterol HFA. Instructed to use 1-2 puffs q 4 hours as needed for SOB.  The patient was urged to return to the Emergency Department immediately with worsening of current symptoms, worsening abdominal pain, persistent vomiting, blood noted in stools, fever, or any other concerns. The patient verbalized understanding.   I did receive paperwork from recent [facility] admission during ED stay. Patient had gram-negative bacteremia from urinary tract infection. There is no evidence of that today. Urine is completely clear.   MDM   Final diagnoses:  Lower abdominal pain  Cough   Patient with lower abdominal pain. Labs are reassuring. Pelvic  exam showed nonfocal generalized pelvic tenderness bilaterally. No vaginal infection noted. No indication for pelvic ultrasound at this time. Do not hold that abdominal CT is indicated. Patient shows no systemic symptoms of illness.  In addition, patient has cough and wheezing that is been worsened she quit smoking several weeks ago. Albuterol inhaler prescribed. No concerns for pneumonia.  No dangerous or life-threatening conditions suspected or identified by history, physical exam, and by work-up. No indications for hospitalization identified.    Renne Crigler, PA-C 02/03/15 1452  Doug Sou, MD 02/03/15 1539

## 2015-02-03 NOTE — ED Notes (Signed)
Pt reports previous hospitalization for kidney infection in [place], states she was discharged and has been unable to follow up with urologist due to being at [facility], today she noticed dark concentrated urine and developed bilateral lower abdominal and back pain

## 2015-02-03 NOTE — Discharge Instructions (Signed)
Please read and follow all provided instructions.  Your diagnoses today include:  1. Lower abdominal pain   2. Cough     Tests performed today include:  Blood counts and electrolytes  Blood tests to check liver and kidney function  Urine test to look for infection and pregnancy (in women) - no infection  Vital signs. See below for your results today.   Medications prescribed:   Albuterol inhaler - medication that opens up your airway  Use inhaler as follows: 1-2 puffs with spacer every 4 hours as needed for wheezing, cough, or shortness of breath.    Naproxen - anti-inflammatory pain medication  Do not exceed 500mg  naproxen every 12 hours, take with food  You have been prescribed an anti-inflammatory medication or NSAID. Take with food. Take smallest effective dose for the shortest duration needed for your pain. Stop taking if you experience stomach pain or vomiting.   Take any prescribed medications only as directed.  Home care instructions:   Follow any educational materials contained in this packet.  Follow-up instructions: Please follow-up with your primary care provider in the next 3 days for further evaluation of your symptoms.    Return instructions:  SEEK IMMEDIATE MEDICAL ATTENTION IF:  The pain does not go away or becomes severe   A temperature above 101F develops   Repeated vomiting occurs (multiple episodes)   The pain becomes localized to portions of the abdomen. The right side could possibly be appendicitis. In an adult, the left lower portion of the abdomen could be colitis or diverticulitis.   Blood is being passed in stools or vomit (bright red or black tarry stools)   You develop chest pain, difficulty breathing, dizziness or fainting, or become confused, poorly responsive, or inconsolable (young children)  If you have any other emergent concerns regarding your health  Additional Information: Abdominal (belly) pain can be caused by many  things. Your caregiver performed an examination and possibly ordered blood/urine tests and imaging (CT scan, x-rays, ultrasound). Many cases can be observed and treated at home after initial evaluation in the emergency department. Even though you are being discharged home, abdominal pain can be unpredictable. Therefore, you need a repeated exam if your pain does not resolve, returns, or worsens. Most patients with abdominal pain don't have to be admitted to the hospital or have surgery, but serious problems like appendicitis and gallbladder attacks can start out as nonspecific pain. Many abdominal conditions cannot be diagnosed in one visit, so follow-up evaluations are very important.  Your vital signs today were: BP 115/75 mmHg   Pulse 96   Temp(Src) 97.7 F (36.5 C) (Oral)   Resp 20   Ht 4\' 11"  (1.499 m)   Wt 82 lb (37.195 kg)   BMI 16.55 kg/m2   SpO2 100% If your blood pressure (bp) was elevated above 135/85 this visit, please have this repeated by your doctor within one month. --------------

## 2015-02-04 LAB — GC/CHLAMYDIA PROBE AMP (~~LOC~~) NOT AT ARMC
Chlamydia: NEGATIVE
NEISSERIA GONORRHEA: NEGATIVE

## 2015-02-07 ENCOUNTER — Emergency Department (HOSPITAL_BASED_OUTPATIENT_CLINIC_OR_DEPARTMENT_OTHER): Payer: Medicaid Other

## 2015-02-07 ENCOUNTER — Emergency Department (HOSPITAL_BASED_OUTPATIENT_CLINIC_OR_DEPARTMENT_OTHER)
Admission: EM | Admit: 2015-02-07 | Discharge: 2015-02-08 | Disposition: A | Discharge status: Home / Self Care | Payer: Self-pay | Attending: Emergency Medicine | Admitting: Emergency Medicine

## 2015-02-07 ENCOUNTER — Encounter (HOSPITAL_BASED_OUTPATIENT_CLINIC_OR_DEPARTMENT_OTHER): Payer: Self-pay | Admitting: *Deleted

## 2015-02-07 DIAGNOSIS — N179 Acute kidney failure, unspecified: Secondary | ICD-10-CM | POA: Insufficient documentation

## 2015-02-07 DIAGNOSIS — J441 Chronic obstructive pulmonary disease with (acute) exacerbation: Secondary | ICD-10-CM | POA: Insufficient documentation

## 2015-02-07 DIAGNOSIS — Z79899 Other long term (current) drug therapy: Secondary | ICD-10-CM | POA: Insufficient documentation

## 2015-02-07 DIAGNOSIS — Z87891 Personal history of nicotine dependence: Secondary | ICD-10-CM | POA: Insufficient documentation

## 2015-02-07 DIAGNOSIS — Z8659 Personal history of other mental and behavioral disorders: Secondary | ICD-10-CM | POA: Insufficient documentation

## 2015-02-07 DIAGNOSIS — Z791 Long term (current) use of non-steroidal anti-inflammatories (NSAID): Secondary | ICD-10-CM | POA: Insufficient documentation

## 2015-02-07 LAB — CBC WITH DIFFERENTIAL/PLATELET
Basophils Absolute: 0 10*3/uL (ref 0.0–0.1)
Basophils Relative: 1 %
EOS ABS: 0.1 10*3/uL (ref 0.0–0.7)
EOS PCT: 1 %
HCT: 31.2 % — ABNORMAL LOW (ref 36.0–46.0)
Hemoglobin: 10.3 g/dL — ABNORMAL LOW (ref 12.0–15.0)
LYMPHS ABS: 1.9 10*3/uL (ref 0.7–4.0)
Lymphocytes Relative: 27 %
MCH: 29.2 pg (ref 26.0–34.0)
MCHC: 33 g/dL (ref 30.0–36.0)
MCV: 88.4 fL (ref 78.0–100.0)
MONO ABS: 0.8 10*3/uL (ref 0.1–1.0)
MONOS PCT: 12 %
Neutro Abs: 4.1 10*3/uL (ref 1.7–7.7)
Neutrophils Relative %: 59 %
PLATELETS: 223 10*3/uL (ref 150–400)
RBC: 3.53 MIL/uL — ABNORMAL LOW (ref 3.87–5.11)
RDW: 13.6 % (ref 11.5–15.5)
WBC: 6.9 10*3/uL (ref 4.0–10.5)

## 2015-02-07 LAB — TROPONIN I: Troponin I: <0.03 ng/mL (ref <0.031)

## 2015-02-07 LAB — COMPREHENSIVE METABOLIC PANEL
ALT: 27 U/L (ref 14–54)
AST: 32 U/L (ref 15–41)
Albumin: 4 g/dL (ref 3.5–5.0)
Alkaline Phosphatase: 89 U/L (ref 38–126)
Anion gap: 9 (ref 5–15)
BUN: 27 mg/dL — AB (ref 6–20)
CHLORIDE: 100 mmol/L — AB (ref 101–111)
CO2: 27 mmol/L (ref 22–32)
CREATININE: 1.21 mg/dL — AB (ref 0.44–1.00)
Calcium: 9.2 mg/dL (ref 8.9–10.3)
GFR calc non Af Amer: 51 mL/min — ABNORMAL LOW (ref >60)
GFR, EST AFRICAN AMERICAN: 59 mL/min — AB (ref >60)
Glucose, Bld: 106 mg/dL — ABNORMAL HIGH (ref 65–99)
POTASSIUM: 4.1 mmol/L (ref 3.5–5.1)
SODIUM: 136 mmol/L (ref 135–145)
Total Bilirubin: 0.4 mg/dL (ref 0.3–1.2)
Total Protein: 7.6 g/dL (ref 6.5–8.1)

## 2015-02-07 MED ORDER — IPRATROPIUM-ALBUTEROL 0.5-2.5 (3) MG/3ML IN SOLN
RESPIRATORY_TRACT | Status: AC
  Administered 2015-02-07: 3 mL via RESPIRATORY_TRACT
  Filled 2015-02-07: qty 3

## 2015-02-07 MED ORDER — IPRATROPIUM-ALBUTEROL 0.5-2.5 (3) MG/3ML IN SOLN
3.0000 mL | RESPIRATORY_TRACT | Status: AC
  Administered 2015-02-07 (×3): 3 mL via RESPIRATORY_TRACT
  Filled 2015-02-07: qty 6

## 2015-02-07 MED ORDER — ALBUTEROL SULFATE HFA 108 (90 BASE) MCG/ACT IN AERS
2.0000 | INHALATION_SPRAY | Freq: Once | RESPIRATORY_TRACT | Status: AC
  Administered 2015-02-07: 2 via RESPIRATORY_TRACT
  Filled 2015-02-07: qty 6.7

## 2015-02-07 MED ORDER — DOXYCYCLINE HYCLATE 100 MG PO TABS
100.0000 mg | ORAL_TABLET | Freq: Once | ORAL | Status: AC
  Administered 2015-02-07: 100 mg via ORAL
  Filled 2015-02-07: qty 1

## 2015-02-07 MED ORDER — ALBUTEROL SULFATE (2.5 MG/3ML) 0.083% IN NEBU
2.5000 mg | INHALATION_SOLUTION | Freq: Once | RESPIRATORY_TRACT | Status: AC
  Administered 2015-02-07: 2.5 mg via RESPIRATORY_TRACT
  Filled 2015-02-07: qty 3

## 2015-02-07 MED ORDER — PREDNISONE 20 MG PO TABS
40.0000 mg | ORAL_TABLET | Freq: Once | ORAL | Status: AC
  Administered 2015-02-07: 40 mg via ORAL
  Filled 2015-02-07: qty 2

## 2015-02-07 MED ORDER — PREDNISONE 20 MG PO TABS: 40.0000 mg | ORAL_TABLET | Freq: Every day | ORAL | Status: DC

## 2015-02-07 MED ORDER — ALBUTEROL SULFATE HFA 108 (90 BASE) MCG/ACT IN AERS: 2.0000 | INHALATION_SPRAY | RESPIRATORY_TRACT | Status: DC | PRN

## 2015-02-07 MED ORDER — DOXYCYCLINE HYCLATE 100 MG PO TABS: 100.0000 mg | ORAL_TABLET | Freq: Two times a day (BID) | ORAL | Status: AC

## 2015-02-07 MED ORDER — IPRATROPIUM-ALBUTEROL 0.5-2.5 (3) MG/3ML IN SOLN
3.0000 mL | Freq: Once | RESPIRATORY_TRACT | Status: AC
  Administered 2015-02-07: 3 mL via RESPIRATORY_TRACT
  Filled 2015-02-07: qty 3

## 2015-02-07 NOTE — ED Notes (Signed)
Pt assessed in triage by RT and breathing tx started per protocol

## 2015-02-07 NOTE — ED Notes (Signed)
Back from b/r, steady gait, no dyspnea noted, intermittent cough remains[facility] 2nd troponin drawn from PIV and sent.

## 2015-02-07 NOTE — ED Notes (Signed)
C/o sob, cough, HA, rib pain, chest tightness, onset 3d ago, denies fever, recent inhalers PTA and upon arrival for wheezing. Stopped smoking 1 month ago, wears a nicotine patch.

## 2015-02-07 NOTE — ED Notes (Signed)
Pt to xray

## 2015-02-07 NOTE — ED Notes (Addendum)
[facility]- reports 3 days of SOB- coughing- using inhaler without relief- assessed in triage by RT

## 2015-02-07 NOTE — ED Notes (Signed)
[facility], pending xray results.

## 2015-02-07 NOTE — ED Notes (Signed)
Pt has stress incontinence with coughing, linens changed, up to b/r, steady gait, "feel better, breathing easier, calmer, coughing less".

## 2015-02-07 NOTE — ED Provider Notes (Signed)
CSN: [id number]     Arrival date & time 02/07/15  1937 History  By signing my name below, I, Ronney Lion, attest that this documentation has been prepared under the direction and in the presence of Marily Memos, MD. Electronically Signed: Ronney Lion, ED Scribe. 02/07/2015. 10:21 PM.   Chief Complaint  Patient presents with  . Shortness of Breath   The history is provided by the patient. No language interpreter was used.    HPI Comments: Brittany Sanford is a 54 y.o. female with a history of bronchitis, Bipolar 1, ADHD, and substance abuse, who presents to the Emergency Department complaining of gradual-onset, constant, worsening SOB that began 3 days ago. Patient states she had been seen at the ED here 4 days ago with dark urine and lower abdominal pain, although her symptoms have resolved since. She states her SOB has been worsening since to the point that she has been hyperventilating today. She was given an inhaler at that time, which she has been using with mild relief. Patient reports a mostly dry cough, although she notes she once coughed up dark sputum. She states she has not smoked for the past month. She reports significant improvement to her symptoms since receiving a breathing treatment in the ED here. She denies being on any hormone therapies at this time.   Past Medical History  Diagnosis Date  . Bronchitis   . Bipolar 1 disorder (HCC)   . Attention deficit disorder   . ADHD (attention deficit hyperactivity disorder)   . Substance abuse    History reviewed. No pertinent past surgical history. Family History  Problem Relation Age of Onset  . COPD Mother   . Cancer Father    Social History  Substance Use Topics  . Smoking status: Former Smoker -- 1.00 packs/day    Types: Cigarettes    Quit date: 01/22/2015  . Smokeless tobacco: Never Used  . 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   All other systems reviewed and are negative.  Allergies  Darvocet  Home Medications   Prior to Admission medications   Medication Sig Start Date End Date Taking? Authorizing Provider  albuterol (PROVENTIL HFA;VENTOLIN HFA) 108 (90 BASE) MCG/ACT inhaler Inhale 2 puffs into the lungs every 4 (four) hours as needed for wheezing or shortness of breath.   Yes Historical Provider, MD  diphenhydrAMINE (BENADRYL) 25 MG tablet Take 25 mg by mouth every 6 (six) hours as needed.   Yes Historical Provider, MD  ENSURE PLUS (ENSURE PLUS) LIQD Take 237 mLs by mouth 3 (three) times daily between meals.   Yes Historical Provider, MD  guaiFENesin (MUCINEX) 600 MG 12 hr tablet Take by mouth 2 (two) times daily.   Yes Historical Provider, MD  naproxen (NAPROSYN) 500 MG tablet Take 1 tablet (500 mg total) by mouth 2 (two) times daily. 02/03/15  Yes Renne Crigler, PA-C  ibuprofen (ADVIL,MOTRIN) 200 MG tablet Take 200 mg by mouth every 6 (six) hours as needed.    Historical Provider, MD  nicotine (NICODERM CQ - DOSED IN MG/24 HOURS) 21 mg/24hr patch Place 21 mg onto the skin daily.    Historical Provider, MD   BP 121/72 mmHg  Pulse 114  Temp(Src) 98.4 F (36.9 C) (Oral)  Resp 34  Wt 82 lb (37.195 kg)  SpO2 92% Physical Exam  Constitutional: She is oriented to person, place, and time. She appears well-developed and well-nourished. No distress.  HENT:  Head: Normocephalic and atraumatic.  Eyes: Conjunctivae and EOM are normal.  Neck: Neck supple. No tracheal deviation present.  Cardiovascular: Normal rate.   Pulmonary/Chest: Effort normal. No tachypnea. No respiratory distress. She has wheezes. She has no rhonchi. She has no rales.  Good breath sounds. Diffuse wheezing. No rhonchi or rales.   Musculoskeletal: Normal range of motion.  Neurological: She is alert and oriented to person, place, and time.  Skin: Skin is  warm and dry.  Psychiatric: She has a normal mood and affect. Her behavior is normal.  Nursing note and vitals reviewed.   ED Course  Procedures (including critical care time)  DIAGNOSTIC STUDIES: Oxygen Saturation is 92% on RA, adequate by my interpretation.    COORDINATION OF CARE: 9:04 PM - Discussed treatment plan with pt at bedside which includes Rx steroids and antibiotics. Pt verbalized understanding and agreed to plan.   Labs Review Labs Reviewed  COMPREHENSIVE METABOLIC PANEL - Abnormal; Notable for the following:    Chloride 100 (*)    Glucose, Bld 106 (*)    BUN 27 (*)    Creatinine, Ser 1.21 (*)    GFR calc non Af Amer 51 (*)    GFR calc Af Amer 59 (*)    All other components within normal limits  CBC WITH DIFFERENTIAL/PLATELET - Abnormal; Notable for the following:    RBC 3.53 (*)    Hemoglobin 10.3 (*)    HCT 31.2 (*)    All other components within normal limits  TROPONIN I    Imaging Review Dg Chest 2 View  02/07/2015  CLINICAL DATA:  Very short of breath for 2 days, EXAM: CHEST  2 VIEW COMPARISON:  CT chest Sep 05, 2014 FINDINGS: Cardiomediastinal silhouette is normal. Mild chronic interstitial changes, increased lung volumes with flattened hemidiaphragm. No pleural effusion focal consolidation. No pneumothorax. Severe upper thoracic levoscoliosis. Straightened thoracic kyphosis. Soft tissue planes are nonsuspicious. Multiple lead pads overlie the patient, resulting in spurious appearance of LEFT upper lobe nodule. IMPRESSION: COPD, no superimposed acute cardiopulmonary process. Electronically Signed   By: Awilda Metroourtnay  Bloomer M.D.   On: 02/07/2015 21:11   I have personally reviewed and evaluated these images and lab results as part of my medical decision-making.   EKG Interpretation   Date/Time:  Sunday February 07 2015 20:45:05 EDT Ventricular Rate:  107 PR Interval:  128 QRS Duration: 84 QT Interval:  352 QTC Calculation: 469 R Axis:   79 Text  Interpretation:  Sinus tachycardia Possible Left atrial enlargement  Borderline ECG Confirmed by Lela Gell MD, Barbara CowerJASON [phone]([id number]) on 02/07/2015  10:16:32 PM      MDM   Final diagnoses:  COPD exacerbation (HCC)  Acute kidney injury (HCC)   54 year old female here with his COPD exacerbation. Her symptoms improved with some duo nebs and steroids antibiotics. Delta troponins were negative. EKG was normal. She was tachycardic on discharge is likely secondary to albuterol administration. She also had acute kidney injury and she'll follow up with urgent care or primary doctor to get a recheck in 1-2 weeks. She'll increase her fluid intake and the meantime.  I have personally and contemperaneously reviewed labs and imaging and used in my decision making as above.   A medical screening exam was performed and  I feel the patient has had an appropriate workup for their chief complaint at this time and likelihood of emergent condition existing is low. They have been counseled on decision, discharge, follow up and which symptoms necessitate immediate return to the emergency department. They or their family verbally stated understanding and agreement with plan and discharged in stable condition.    I personally performed the services described in this documentation, which was scribed in my presence. The recorded information has been reviewed and is accurate.      Marily Memos, MD 02/08/15 [phone]

## 2015-02-08 NOTE — Discharge Instructions (Signed)
°Emergency Department Resource Guide °1) Find a Doctor and Pay Out of Pocket °Although you won't have to find out who is covered by your insurance plan, it is a good idea to ask around and get recommendations. You will then need to call the office and see if the doctor you have chosen will accept you as a new patient and what types of options they offer for patients who are self-pay. Some doctors offer discounts or will set up payment plans for their patients who do not have insurance, but you will need to ask so you aren't surprised when you get to your appointment. ° °2) Contact Your Local Health Department °Not all health departments have doctors that can see patients for sick visits, but many do, so it is worth a call to see if yours does. If you don't know where your local health department is, you can check in your phone book. The CDC also has a tool to help you locate your state's health department, and many state websites also have listings of all of their local health departments. ° °3) Find a Walk-in Clinic °If your illness is not likely to be very severe or complicated, you may want to try a walk in clinic. These are popping up all over the country in pharmacies, drugstores, and shopping centers. They're usually staffed by nurse practitioners or physician assistants that have been trained to treat common illnesses and complaints. They're usually fairly quick and inexpensive. However, if you have serious medical issues or chronic medical problems, these are probably not your best option. ° °No Primary Care Doctor: °- Call [facility] Connect at  832-8000 - they can help you locate a primary care doctor that  accepts your insurance, provides certain services, etc. °- Physician Referral Service- 1-800-533-3463 ° °Chronic Pain Problems: °Organization         Address  Phone   Notes  °[facility]  (336) 297-2271 Patients need to be referred by their primary care doctor.  ° °Medication  Assistance: °Organization         Address  Phone   Notes  °[place] [facility] [address] °[place], [place] [address] (336) 641-8030 --Must be a resident of [place] °-- Must have NO insurance coverage whatsoever (no Medicaid/ Medicare, etc.) °-- The pt. MUST have a primary care doctor that directs their care regularly and follows them in the community °  °[facility]  (866) 331-1348   °[employer]  (888) 892-1162   ° °Agencies that provide inexpensive medical care: °Organization         Address  Phone   Notes  °[facility]  (336) 832-8035   °[facility]    (336) 832-7272   °[facility] [address] °[place], [place] [address] (336) 832-4777   °[facility] [address], °[place] (336) 271-4999   °[facility]    (336) 373-0678   °[facility]    (336) 272-1050   °[facility] ° [address], [place] Phone:  (336) 832-4444, Fax:  (336) 832-4440 Hours of Operation:  9 am - 6 pm, M-F.  Also accepts Medicaid/Medicare and self-pay.  °[facility] ° [address], [place] Phone: (336) 832-3150, Fax: (336) 832-3151. Hours of Operation:  8:30 am - 5:30 pm, M-F.  Also accepts Medicaid and self-pay.  °[facility] [place] [address], [place] Phone: (336) 878-6027   °[facility] [address], [place], [place] (336)723-1848, Ext. 123 Mondays & Thursdays: 7-9 AM.  First 15 patients are seen on a first come, first serve basis. °  ° °Medicaid-accepting [place] Providers: ° °Organization         Address  Phone   Notes  °[facility] [address], [place] (336) 641-2100 Also accepts self-pay patients.  °[facility] [address], [place] ° (336) 856-9996   °[facility] [address], [place]  (336) 288-8857   °[facility] [address] [place] [address], [place] (336) 299-7000   °Veita Bland [address], [place]  ° (336) 373-1557 Only accepts [place] Access Medicaid patients after they have their name applied to their card.  ° °Self-Pay (no insurance) in [place]: ° °Organization         Address  Phone   Notes  °Sickle Cell Patients, [facility] [address], [place] (336) 832-1970   °[facility] [address], [place] (336) 832-4400   °[facility] ° [address], [place] (336) 992-4800   °[facility]/Dr. Osei-Bonsu ° [address] [place] [address], [place] or [address], [place] (336) 841-8500 Phone number for both [place] and [place] locations is the same.  °[facility] [address], [place] (336) 299-0000   °[facility] [address] [place] [address], [place] or [address] (336) 852-7530 °(336) 878-2260   °[facility] [address], [place] (336) 350-1642, phone; (336) 294-5005, fax Sees patients 1st and 3rd Saturday of every month.  Must not qualify for public or private insurance (i.e. Medicaid, Medicare, [place] Health Choice, Veterans' Benefits) • Household income should be no more than 200% of the poverty level •The clinic cannot treat you if you are pregnant or think you are pregnant • Sexually transmitted diseases are not treated at the clinic.  ° ° °Dental Care: °Organization         Address  Phone  Notes  °[place] [facility] [address], [place] (336) 641-6152 Accepts children up to age 21 who are enrolled in Medicaid or [place] Health Choice; pregnant women with a Medicaid card; and children who have applied for Medicaid or [place] Health Choice, but were declined, whose parents can pay a reduced fee at time of service.  °[place]  [facility] [place]  [address], [place] (336) 641-7733 Accepts children up to age 21 who are enrolled in Medicaid or [place] Health Choice; pregnant women with a Medicaid card; and children who have applied for Medicaid or [place] Health Choice, but were declined, whose parents can pay a reduced fee at time of service.  °[facility] ° [address],  (336) 641-4533 Patients are seen by appointment only. Walk-ins are not accepted. [facility] will see patients 18 years of age and older. °Monday - Tuesday (8am-5pm) °Most Wednesdays (8:30-5pm) °$30 per visit, cash only  °[facility] ° [address], [place] (336) 641-4533 Patients are seen by appointment only. Walk-ins are not accepted. [facility] will see patients 18 years of age and older. °One   Wednesday Evening (Monthly: Volunteer Based).  $30 per visit, cash only  °[facility]  (919) 537-3737 for adults; Children under age 4, call Graduate Pediatric Dentistry at (919) 537-3956. Children aged 4-14, please call (919) 537-3737 to request a pediatric application. ° Dental services are provided in all areas of dental care including fillings, crowns and bridges, complete and partial dentures, implants, gum treatment, root canals, and extractions. Preventive care is also provided. Treatment is provided to both adults and children. °Patients are selected via a lottery and there is often a waiting list. °  °[facility] [address], °[place] ° (336) 763-8833 [email or web address] °  °[facility] [address], [place], [place] (336)723-1848, Ext. 123 Second and Fourth Thursday of each month, opens at 6:30 AM; Clinic ends at 9 AM.  Patients are seen on a first-come first-served basis, and a limited number are seen during each clinic.  ° °[facility] ° [address], [place], [place] (336) 723-7904    Eligibility Requirements °You must have lived in [place], [place], or [place] counties for at least the last three months. °  You cannot be eligible for state or federal sponsored healthcare insurance, including Veterans Administration, Medicaid, or Medicare. °  You generally cannot be eligible for healthcare insurance through your employer.  °  How to apply: °Eligibility screenings are held every Tuesday and Wednesday afternoon from 1:00 pm until 4:00 pm. You do not need an appointment for the interview!  °[facility] [address], [place], [place] 336-631-2330   °[facility] [facility]  336-342-8273   °[facility]  336-703-3100   °[facility]  336-570-6415   ° °Behavioral Health Resources in the Community: °Intensive Outpatient Programs °Organization         Address  Phone  Notes  °[facility] [address], [place], [place] 336-878-6098   °[facility] [address], [place], [place] 336-832-9800   °[facility] [address], [place], [place] ° 336-882-2125   °[facility] [address],  °[place], [place] 1-800-853-5163 or 336-641-4981   °Substance Abuse Resources °Organization         Address  Phone  Notes  °[facility]  336-882-2125   °[facility]  336-784-9470   °[facility]  336-285-9073   °[facility]  336-845-3988   °Residential & Outpatient Substance Abuse Program  1-800-659-3381   °Psychological Services °Organization         Address  Phone  Notes  °[facility]  336- 832-9600   °[facility]  336- 378-7881   °[facility] [address], [place] 1-800-853-5163 or 336-641-4981   ° °Mobile Crisis Teams °Organization         Address  Phone  Notes  °[facility], Mobile Crisis Care Unit  1-877-626-1772   °Assertive °[facility] ° [address]  [place], [place] 336-834-9664   °Sharon DeEsch [address] °[place] 336-554-5454   ° °Self-Help/Support Groups °Organization         Address  Phone             Notes  °[facility]  - variety of support groups  336- 373-1402 Call for more information  °Narcotics Anonymous (NA), [facility] [address], °[place]  2 meetings at this location  ° °  Residential Treatment Programs °Organization         Address  Phone  Notes  °[facility] [address],    °[place]  1-866-801-8205   °[facility] ° [address], [place], [place] 704-293-8524   °[facility] [address], [place] 336-845-3988 Admissions: 8am-3pm M-F  °[facility] [address]    °[place], [place] 336-841-1104   °[facility] [address], [place], [place] 336-379-7146   °[facility] [address]  °[place], [place] 336-285-9073   °[facility] Intensive Outpatient [address], [place], [place] 336-852-3033   °[facility] [address]  °[place], [place] 1-877-615-2722 or 336-784-9470   °[facility] ([facility]) [address] , [place] 336-227-7417 Accepts Medicaid  °[facility] [address]  ° [place] 1-800-659-3381 Substance Abuse/Addiction Treatment  ° °[place] Behavioral Health Resources °Organization         Address  Phone  Notes  °[facility]  (888) 581-9988   °Julie Brannon, PhD [address] [place], [place]   (336) 349-5553 or (336) 951-0000   °[facility]   [address] °[place], [place] (336) 349-4454   °[facility] [address], [place], [place] (336) 342-8316 Insurance/Medicaid/sponsorship through [facility]  °[facility] [address]                                    [place], [place] (336) 342-8316 Therapy/tele-psych/case    °[facility] [address]  ° [place],  (336) 349-2233    °Dr. Arfeen  (336) 349-4544   °[facility] [place]  [employer] [place] [facility] 1) [address], [place] °2) [address], [place] °3)  [address], [place] (336) 349-3220 °(336) 342-7768 ° °(336) 342-8140   °[place] Child Abuse [facility] (336) 342-1394 or (336) 342-3537 (After Hours)    ° ° °

## 2015-02-08 NOTE — ED Notes (Signed)
Dr. Clayborne DanaMesner in to see pt at time of d/c.

## 2016-05-17 ENCOUNTER — Emergency Department: Payer: Self-pay

## 2016-05-17 ENCOUNTER — Emergency Department
Admission: EM | Admit: 2016-05-17 | Discharge: 2016-05-18 | Disposition: A | Discharge status: Home / Self Care | Payer: Self-pay | Attending: Emergency Medicine | Admitting: Emergency Medicine

## 2016-05-17 ENCOUNTER — Encounter: Payer: Self-pay | Admitting: Emergency Medicine

## 2016-05-17 DIAGNOSIS — F909 Attention-deficit hyperactivity disorder, unspecified type: Secondary | ICD-10-CM | POA: Insufficient documentation

## 2016-05-17 DIAGNOSIS — F1490 Cocaine use, unspecified, uncomplicated: Secondary | ICD-10-CM | POA: Insufficient documentation

## 2016-05-17 DIAGNOSIS — Z79899 Other long term (current) drug therapy: Secondary | ICD-10-CM | POA: Insufficient documentation

## 2016-05-17 DIAGNOSIS — F17210 Nicotine dependence, cigarettes, uncomplicated: Secondary | ICD-10-CM | POA: Insufficient documentation

## 2016-05-17 DIAGNOSIS — R109 Unspecified abdominal pain: Secondary | ICD-10-CM

## 2016-05-17 DIAGNOSIS — N2882 Megaloureter: Secondary | ICD-10-CM | POA: Insufficient documentation

## 2016-05-17 LAB — URINALYSIS, COMPLETE (UACMP) WITH MICROSCOPIC
Bacteria, UA: NONE SEEN
Bilirubin Urine: NEGATIVE
Glucose, UA: NEGATIVE mg/dL
HGB URINE DIPSTICK: NEGATIVE
KETONES UR: NEGATIVE mg/dL
Leukocytes, UA: NEGATIVE
Nitrite: NEGATIVE
PROTEIN: NEGATIVE mg/dL
Specific Gravity, Urine: 1.003 — ABNORMAL LOW (ref 1.005–1.030)
pH: 5 (ref 5.0–8.0)

## 2016-05-17 LAB — CBC
HCT: 39.9 % (ref 35.0–47.0)
Hemoglobin: 14.2 g/dL (ref 12.0–16.0)
MCH: 31.7 pg (ref 26.0–34.0)
MCHC: 35.5 g/dL (ref 32.0–36.0)
MCV: 89.5 fL (ref 80.0–100.0)
PLATELETS: 335 10*3/uL (ref 150–440)
RBC: 4.46 MIL/uL (ref 3.80–5.20)
RDW: 13.9 % (ref 11.5–14.5)
WBC: 7 10*3/uL (ref 3.6–11.0)

## 2016-05-17 LAB — COMPREHENSIVE METABOLIC PANEL
ALT: 19 U/L (ref 14–54)
AST: 36 U/L (ref 15–41)
Albumin: 4.6 g/dL (ref 3.5–5.0)
Alkaline Phosphatase: 82 U/L (ref 38–126)
Anion gap: 6 (ref 5–15)
BUN: 11 mg/dL (ref 6–20)
CHLORIDE: 98 mmol/L — AB (ref 101–111)
CO2: 28 mmol/L (ref 22–32)
CREATININE: 0.58 mg/dL (ref 0.44–1.00)
Calcium: 9.2 mg/dL (ref 8.9–10.3)
GFR calc Af Amer: >60 mL/min (ref >60)
GFR calc non Af Amer: >60 mL/min (ref >60)
GLUCOSE: 82 mg/dL (ref 65–99)
Potassium: 4.9 mmol/L (ref 3.5–5.1)
SODIUM: 132 mmol/L — AB (ref 135–145)
Total Bilirubin: 0.4 mg/dL (ref 0.3–1.2)
Total Protein: 7.5 g/dL (ref 6.5–8.1)

## 2016-05-17 LAB — TYPE AND SCREEN
ABO/RH(D): O POS
Antibody Screen: NEGATIVE

## 2016-05-17 MED ORDER — SODIUM CHLORIDE 0.9 % IV BOLUS (SEPSIS)
1000.0000 mL | Freq: Once | INTRAVENOUS | Status: AC
  Administered 2016-05-17: 1000 mL via INTRAVENOUS

## 2016-05-17 MED ORDER — ALBUTEROL SULFATE HFA 108 (90 BASE) MCG/ACT IN AERS: 2.0000 | INHALATION_SPRAY | Freq: Once | RESPIRATORY_TRACT | Status: DC

## 2016-05-17 MED ORDER — IPRATROPIUM-ALBUTEROL 0.5-2.5 (3) MG/3ML IN SOLN
3.0000 mL | Freq: Once | RESPIRATORY_TRACT | Status: AC
  Administered 2016-05-17: 3 mL via RESPIRATORY_TRACT
  Filled 2016-05-17: qty 3

## 2016-05-17 MED ORDER — KETOROLAC TROMETHAMINE 30 MG/ML IJ SOLN
30.0000 mg | Freq: Once | INTRAMUSCULAR | Status: AC
  Administered 2016-05-17: 30 mg via INTRAVENOUS
  Filled 2016-05-17: qty 1

## 2016-05-17 MED ORDER — IOPAMIDOL (ISOVUE-300) INJECTION 61%
100.0000 mL | Freq: Once | INTRAVENOUS | Status: AC | PRN
  Administered 2016-05-17: 100 mL via INTRAVENOUS

## 2016-05-17 MED ORDER — KETOROLAC TROMETHAMINE 30 MG/ML IJ SOLN
30.0000 mg | Freq: Once | INTRAMUSCULAR | Status: DC
  Filled 2016-05-17: qty 1

## 2016-05-17 MED ORDER — SODIUM CHLORIDE 0.9 % IV BOLUS (SEPSIS): 1000.0000 mL | Freq: Once | INTRAVENOUS | Status: DC

## 2016-05-17 MED ORDER — IOPAMIDOL (ISOVUE-300) INJECTION 61%
30.0000 mL | Freq: Once | INTRAVENOUS | Status: AC | PRN
  Administered 2016-05-17: 30 mL via ORAL

## 2016-05-17 NOTE — ED Notes (Signed)
Pt refusing IV and IM medication. MD made aware.

## 2016-05-17 NOTE — ED Provider Notes (Signed)
[facility] Emergency Department Provider Note  ____________________________________________  Time seen: Approximately 9:25 PM  I have reviewed the triage vital signs and the nursing notes.   HISTORY  Chief Complaint Abdominal Pain   HPI Brittany Sanford is a 56 y.o. female who presents for evaluation of right-sided abdominal pain. Patient reports the pain has been constant for 3 days, sharp, located in her right flank/right upper quadrant, associated with nausea, nonradiating, initially intermittent and now constant. She's never had similar pain. No prior abdominal surgeries. No history of kidney stones. No vomiting, no dysuria hematuria. She endorses 2 days of diarrhea that started before her abdominal pain. The diarrhea has resolved. No fever or chills. Her pain is currently a 7 out of 10.She denies chest pain or shortness of breath, pain is not pleuritic in nature, reproducible on palpation of the right lateral part of her abdomen.  Past Medical History:  Diagnosis Date  . ADHD (attention deficit hyperactivity disorder)   . Attention deficit disorder   . Bipolar 1 disorder (HCC)   . Bronchitis   . Substance abuse     Patient Active Problem List   Diagnosis Date Noted  . Pyelonephritis 09/06/2014  . SOB (shortness of breath) 09/06/2014  . History of bipolar disorder 04/15/2014  . Alcohol use disorder, severe, dependence (HCC) 04/14/2014  . Bipolar affective disorder (HCC) 04/14/2014  . Substance induced mood disorder (HCC) 04/14/2014  . Alcohol abuse   . Polysubstance abuse     History reviewed. No pertinent surgical history.  Prior to Admission medications   Medication Sig Start Date End Date Taking? Authorizing Provider  diphenhydrAMINE (BENADRYL) 25 MG tablet Take 25 mg by mouth every 6 (six) hours as needed.   Yes Historical Provider, MD  guaiFENesin (MUCINEX) 600 MG 12 hr tablet Take by mouth 2 (two) times daily.   Yes Historical Provider,  MD  ibuprofen (ADVIL,MOTRIN) 200 MG tablet Take 200 mg by mouth every 6 (six) hours as needed.   Yes Historical Provider, MD  albuterol (PROVENTIL HFA;VENTOLIN HFA) 108 (90 Base) MCG/ACT inhaler Inhale 2 puffs into the lungs every 4 (four) hours as needed for wheezing or shortness of breath. 05/18/16   Irean HongJade J Sung, MD  HYDROcodone-acetaminophen (NORCO) 5-325 MG tablet Take 1 tablet by mouth every 6 (six) hours as needed for moderate pain. 05/18/16   Irean HongJade J Sung, MD  ondansetron (ZOFRAN ODT) 4 MG disintegrating tablet Take 1 tablet (4 mg total) by mouth every 8 (eight) hours as needed for nausea or vomiting. 05/18/16   Irean HongJade J Sung, MD    Allergies Darvocet [propoxyphene n-acetaminophen]  Family History  Problem Relation Age of Onset  . COPD Mother   . Cancer Father     Social History Social History  Substance Use Topics  . Smoking status: Current Every Day Smoker    Packs/day: 1.00    Types: Cigarettes    Last attempt to quit: 01/22/2015  . Smokeless tobacco: Never Used  . Alcohol use Yes     Comment: heavy- in rehab    Review of Systems  Constitutional: Negative for fever. Eyes: Negative for visual changes. ENT: Negative for sore throat. Neck: No neck pain  Cardiovascular: Negative for chest pain. Respiratory: Negative for shortness of breath. Gastrointestinal: + ruq abdominal pain and nausea. No vomiting or diarrhea. Genitourinary: Negative for dysuria. Musculoskeletal: Negative for back pain. Skin: Negative for rash. Neurological: Negative for headaches, weakness or numbness. Psych: No SI or HI  ____________________________________________   PHYSICAL EXAM:  VITAL SIGNS: ED Triage Vitals  Enc Vitals Group     BP 05/17/16 1828 120/80     Pulse Rate 05/17/16 1828 (!) 56     Resp 05/17/16 1828 18     Temp 05/17/16 1828 98.6 F (37 C)     Temp Source 05/17/16 1828 Oral     SpO2 05/17/16 1828 98 %     Weight 05/17/16 1700 100 lb (45.4 kg)     Height 05/17/16 1700 4'  11" (1.499 m)     Head Circumference --      Peak Flow --      Pain Score 05/17/16 1700 10     Pain Loc --      Pain Edu? --      Excl. in GC? --     Constitutional: Alert and oriented. Well appearing and in no apparent distress. HEENT:      Head: Normocephalic and atraumatic.         Eyes: Conjunctivae are normal. Sclera is non-icteric. EOMI. PERRL      Mouth/Throat: Mucous membranes are moist.       Neck: Supple with no signs of meningismus. Cardiovascular: Regular rate and rhythm. No murmurs, gallops, or rubs. 2+ symmetrical distal pulses are present in all extremities. No JVD. Respiratory: Normal respiratory effort. Lungs are clear to auscultation bilaterally. No wheezes, crackles, or rhonchi.  Gastrointestinal: Soft, patient is tender to palpation on the right upper lateral part of her abdomen, negative Murphy sign, no right lower quadrant tenderness, and non distended with positive bowel sounds. No rebound or guarding. Genitourinary: No CVA tenderness. Musculoskeletal: Nontender with normal range of motion in all extremities. No edema, cyanosis, or erythema of extremities. Neurologic: Normal speech and language. Face is symmetric. Moving all extremities. No gross focal neurologic deficits are appreciated. Skin: Skin is warm, dry and intact. No rash noted. Psychiatric: Mood and affect are normal. Speech and behavior are normal.  ____________________________________________   LABS (all labs ordered are listed, but only abnormal results are displayed)  Labs Reviewed  COMPREHENSIVE METABOLIC PANEL - Abnormal; Notable for the following:       Result Value   Sodium 132 (*)    Chloride 98 (*)    All other components within normal limits  URINALYSIS, COMPLETE (UACMP) WITH MICROSCOPIC - Abnormal; Notable for the following:    Color, Urine STRAW (*)    APPearance CLEAR (*)    Specific Gravity, Urine 1.003 (*)    Squamous Epithelial / LPF 0-5 (*)    All other components within  normal limits  CBC  TYPE AND SCREEN   ____________________________________________  EKG  none ____________________________________________  RADIOLOGY  CT A/p: PND ____________________________________________   PROCEDURES  Procedure(s) performed: None Procedures Critical Care performed:  None ____________________________________________   INITIAL IMPRESSION / ASSESSMENT AND PLAN / ED COURSE   56 y.o. female who presents for evaluation of right-sided abdominal pain x 3 days associated with nausea. Patient is tender to palpation on the right upper lateral part of her abdomen, no flank tenderness, negative Murphy sign, no right lower quadrant abdominal tenderness. Vital signs are within normal limits and patient is well-appearing in no distress. Differential diagnoses including kidney stone versus gallbladder versus gastroenteritis versus diverticulitis versus UTI. UA with no evidence of UTI or blood, CMP and CBC with no acute findings. We'll pursue CT abdomen and pelvis.  Clinical Course as of May 18 1546  Wed May 17, 2016  2337 CT pending. Care transferred to Dr. Dolores Frame  [CV]    Clinical Course User Index [CV] Nita Sickle, MD    Pertinent labs & imaging results that were available during my care of the patient were reviewed by me and considered in my medical decision making (see chart for details).    ____________________________________________   FINAL CLINICAL IMPRESSION(S) / ED DIAGNOSES  Final diagnoses:  Abdominal pain, unspecified abdominal location  Right ureter dilated      NEW MEDICATIONS STARTED DURING THIS VISIT:  Discharge Medication List as of 05/18/2016 12:19 AM    START taking these medications   Details  albuterol (PROVENTIL HFA;VENTOLIN HFA) 108 (90 Base) MCG/ACT inhaler Inhale 2 puffs into the lungs every 4 (four) hours as needed for wheezing or shortness of breath., Starting Thu 05/18/2016, Print    HYDROcodone-acetaminophen (NORCO) 5-325  MG tablet Take 1 tablet by mouth every 6 (six) hours as needed for moderate pain., Starting Thu 05/18/2016, Print    ondansetron (ZOFRAN ODT) 4 MG disintegrating tablet Take 1 tablet (4 mg total) by mouth every 8 (eight) hours as needed for nausea or vomiting., Starting Thu 05/18/2016, Print         Note:  This document was prepared using Dragon voice recognition software and may include unintentional dictation errors.    Nita Sickle, MD 05/18/16 [phone]

## 2016-05-17 NOTE — ED Triage Notes (Signed)
Pt comes into the ED via POV c/o RLQ abd pain that is sharp.  Patient states she had black stools for 2 days and she is now having constipation.  Denies any urinary symptoms.  Denies any chest pain, dizziness, or shortness of breath.  Patient explains that she has been nauseated but no emesis present.  Patient in NAD at this time with even and unlabored respirations.

## 2016-05-18 MED ORDER — ALBUTEROL SULFATE HFA 108 (90 BASE) MCG/ACT IN AERS: 2.0000 | INHALATION_SPRAY | RESPIRATORY_TRACT | 0 refills | Status: DC | PRN

## 2016-05-18 MED ORDER — ONDANSETRON 4 MG PO TBDP
4.0000 mg | ORAL_TABLET | Freq: Three times a day (TID) | ORAL | 0 refills | Status: DC | PRN
Start: 2016-05-18 — End: 2018-05-29

## 2016-05-18 MED ORDER — HYDROCODONE-ACETAMINOPHEN 5-325 MG PO TABS: 1.0000 | ORAL_TABLET | Freq: Four times a day (QID) | ORAL | 0 refills | Status: DC | PRN

## 2016-05-18 NOTE — Discharge Instructions (Signed)
1. Your albuterol inhaler prescription has been refill. 2. You may take pain and nausea medicines as needed (Norco/Zofran). 3. Return to the ER for worsening symptoms, persistent vomiting, difficulty breathing or other concerns.

## 2016-05-18 NOTE — ED Provider Notes (Signed)
-----------------------------------------   12:08 AM on 05/18/2016 -----------------------------------------  CT abdomen and pelvis interpreted per Dr. Maeola SarahFuginaga: 1. No CT evidence for acute intra-abdominal or pelvic pathology.  Normal appendix.  2. Mildly dilated right extrarenal pelvis an ureter. No right  ureteral stones are visualized.   Updated patient of CT imaging results. Will refer to urology for further workup of dilated right extrarenal pelvis and ureter. Patient requesting prescription for albuterol inhaler. Will also prescribe limited quantity pain and nausea medications. Strict return precautions given. Patient verbalizes understanding and agrees with plan of care.   Irean HongJade J Sung, MD 05/18/16 [phone]

## 2016-06-27 ENCOUNTER — Ambulatory Visit (HOSPITAL_COMMUNITY)
Admission: RE | Admit: 2016-06-27 | Discharge: 2016-06-27 | Disposition: A | Discharge status: Home / Self Care | Payer: Medicaid Other | Attending: Psychiatry | Admitting: Psychiatry

## 2016-06-27 ENCOUNTER — Encounter (HOSPITAL_COMMUNITY): Payer: Self-pay | Admitting: Emergency Medicine

## 2016-06-27 ENCOUNTER — Emergency Department (HOSPITAL_COMMUNITY)
Admission: EM | Admit: 2016-06-27 | Discharge: 2016-06-27 | Disposition: A | Discharge status: Home / Self Care | Payer: Medicaid Other | Attending: Emergency Medicine | Admitting: Emergency Medicine

## 2016-06-27 DIAGNOSIS — F17210 Nicotine dependence, cigarettes, uncomplicated: Secondary | ICD-10-CM | POA: Insufficient documentation

## 2016-06-27 DIAGNOSIS — F1010 Alcohol abuse, uncomplicated: Secondary | ICD-10-CM | POA: Insufficient documentation

## 2016-06-27 DIAGNOSIS — F909 Attention-deficit hyperactivity disorder, unspecified type: Secondary | ICD-10-CM | POA: Insufficient documentation

## 2016-06-27 NOTE — ED Provider Notes (Signed)
[facility]-EMERGENCY DEPT Provider Note   CSN: [id number] Arrival date & time: 06/27/16  1949     History   Chief Complaint Chief Complaint  Patient presents with  . Requesting Detox    HPI Brittany Sanford is a 56 y.o. female.  HPI   56 year old female presents today to the emergency room requesting alcohol detoxification.  Patient notes that she is a chronic alcoholic drinks approximately 12 beers per day, uses crack cocaine.  She notes that she does not want to drink or use drugs anymore.  She notes she has been through several rehabilitation facilities that have not helped with her addiction.  Patient was told that if she came to the emergency room she could be admitted for detox and rehabilitation.   Past Medical History:  Diagnosis Date  . ADHD (attention deficit hyperactivity disorder)   . Attention deficit disorder   . Bipolar 1 disorder (HCC)   . Bronchitis   . Substance abuse     Patient Active Problem List   Diagnosis Date Noted  . Pyelonephritis 09/06/2014  . SOB (shortness of breath) 09/06/2014  . History of bipolar disorder 04/15/2014  . Alcohol use disorder, severe, dependence (HCC) 04/14/2014  . Bipolar affective disorder (HCC) 04/14/2014  . Substance induced mood disorder (HCC) 04/14/2014  . Alcohol abuse   . Polysubstance abuse     History reviewed. No pertinent surgical history.  OB History    Gravida Para Term Preterm AB Living   2 2           SAB TAB Ectopic Multiple Live Births                   Home Medications    Prior to Admission medications   Medication Sig Start Date End Date Taking? Authorizing Provider  albuterol (PROVENTIL HFA;VENTOLIN HFA) 108 (90 Base) MCG/ACT inhaler Inhale 2 puffs into the lungs every 4 (four) hours as needed for wheezing or shortness of breath. 05/18/16   Irean Hong, MD  diphenhydrAMINE (BENADRYL) 25 MG tablet Take 25 mg by mouth every 6 (six) hours as needed.    Historical Provider, MD  guaiFENesin (MUCINEX) 600  MG 12 hr tablet Take by mouth 2 (two) times daily.    Historical Provider, MD  HYDROcodone-acetaminophen (NORCO) 5-325 MG tablet Take 1 tablet by mouth every 6 (six) hours as needed for moderate pain. 05/18/16   Irean Hong, MD  ibuprofen (ADVIL,MOTRIN) 200 MG tablet Take 200 mg by mouth every 6 (six) hours as needed.    Historical Provider, MD  ondansetron (ZOFRAN ODT) 4 MG disintegrating tablet Take 1 tablet (4 mg total) by mouth every 8 (eight) hours as needed for nausea or vomiting. 05/18/16   Irean Hong, MD    Family History Family History  Problem Relation Age of Onset  . COPD Mother   . Cancer Father     Social History Social History  Substance Use Topics  . Smoking status: Current Every Day Smoker    Packs/day: 1.00    Types: Cigarettes    Last attempt to quit: 01/22/2015  . Smokeless tobacco: Never Used  . Alcohol use Yes     Comment: 12 pack daily     Allergies   Darvocet [propoxyphene n-acetaminophen]   Review of Systems Review of Systems  All other systems reviewed and are negative.    Physical Exam Updated Vital Signs BP (!) 139/92 (BP Location: Right Arm)   Pulse 92  Temp 98.3 F (36.8 C) (Oral)   Resp 16   SpO2 96%   Physical Exam  Constitutional: She is oriented to person, place, and time. She appears well-developed and well-nourished.  HENT:  Head: Normocephalic and atraumatic.  Eyes: Conjunctivae are normal. Pupils are equal, round, and reactive to light. Right eye exhibits no discharge. Left eye exhibits no discharge. No scleral icterus.  Neck: Normal range of motion. No JVD present. No tracheal deviation present.  Pulmonary/Chest: Effort normal. No stridor.  Neurological: She is alert and oriented to person, place, and time. Coordination normal.  Psychiatric: She has a normal mood and affect. Her behavior is normal. Judgment and thought content normal.  Nursing note and vitals reviewed.    ED Treatments / Results  Labs (all labs ordered  are listed, but only abnormal results are displayed) Labs Reviewed - No data to display  EKG  EKG Interpretation None       Radiology No results found.  Procedures Procedures (including critical care time)  Medications Ordered in ED Medications - No data to display   Initial Impression / Assessment and Plan / ED Course  I have reviewed the triage vital signs and the nursing notes.  Pertinent labs & imaging results that were available during my care of the patient were reviewed by me and considered in my medical decision making (see chart for details).      Final Clinical Impressions(s) / ED Diagnoses   Final diagnoses:  Alcohol abuse   Labs:    Imaging:  Consults: Social  Work   Therapeutics:  Discharge Meds:   56 year old female presents today slightly intoxicated requesting alcohol and drug detox.  Patient is clear and coherent, she does not appear to be significantly intoxicated ataxic.  No signs of withdrawal.  I informed her that we do not do alcohol detox here in the hospital setting for uncomplicated presentations.  Patient is tearful reporting that she does not want to continue drinking.  Social work will be consulted.     New Prescriptions New Prescriptions   No medications on file     Eyvonne MechanicJeffrey Emanuel Campos, PA-C 06/27/16 2119    Arby BarretteMarcy Pfeiffer, MD 07/08/16 1350

## 2016-06-27 NOTE — Discharge Instructions (Signed)
Please read attached information. If you experience any new or worsening signs or symptoms please return to the emergency room for evaluation. Please follow-up with your primary care provider or specialist as discussed.  °

## 2016-06-27 NOTE — ED Notes (Signed)
Pt lung sounds are rhonchi bilaterally .

## 2016-06-27 NOTE — ED Notes (Signed)
Pt is alert and oriented with intermittent tearfulness, pt states that she has a drinking problem and uses cocaine use and is requesting help for her substance problems. Pt states that she has no where to go no money.

## 2016-06-27 NOTE — ED Triage Notes (Signed)
Pt requesting help with drinking and cocaine addiction; pt states she's been drinking 12 pack a day for 1 year and a half; reports daily cocaine use; last drink at 1945pm; pt states she has right-sided pain "24/7"; states she "always hears stuff" but is unable to determine if is a hallucination or just drug/alcohol-related; pt states she is depressed about her mother passing 3 year ago; denies SI/HI

## 2016-06-27 NOTE — ED Notes (Signed)
Pt continues to be tearful stating no one will help her, made pt aware per Social worker that [facility],

## 2016-06-27 NOTE — Progress Notes (Addendum)
Consult request has been received. CSW following up at present time.  CSW met with pt and gathered collateral information regarding pt's D/C plans.  Pt stated initially she had no plans for D/C then received a phone call from [facility] on [employer] requesting the pt arrive for an assessment once D/C's from [facility] ED.  CSW confirmed with pt she was asked to arrive to [facility] and agreed to provide directions. CSW will update RN, Camera operator and EDP.  CSW provided pt with shelter, inpatient and outpatient treatment options and resources for food once discharged from [facility] if she is accepted.  Please reconsult if future social work needs arise.     Alphonse Guild. Laderius Valbuena, Latanya Presser, LCAS Clinical Social Worker Ph: [phone]

## 2016-06-27 NOTE — [facility] Counselor (Addendum)
[facility] for assessment for detox from alcohol and cocaine. Per pt record, at about 9:30 pm [facility] SW Christiane HaJonathan Riffey sts that pt received a phone call on her phone and stated that someone [facility] had told her to come here for an assessment.  (See Jonathan's note in EPIC.) No one in TTS or SW from [facility] could be identified as contacting the pt.   Beryle FlockMary Massey Ruhland, MS, CRC, [facility] Triage Specialist [facility]

## 2016-06-28 ENCOUNTER — Emergency Department (HOSPITAL_COMMUNITY)
Admission: EM | Admit: 2016-06-28 | Discharge: 2016-06-28 | Disposition: A | Discharge status: Home / Self Care | Payer: Medicaid Other | Attending: Emergency Medicine | Admitting: Emergency Medicine

## 2016-06-28 ENCOUNTER — Encounter (HOSPITAL_COMMUNITY): Payer: Self-pay

## 2016-06-28 ENCOUNTER — Encounter (HOSPITAL_COMMUNITY): Payer: Self-pay | Admitting: Emergency Medicine

## 2016-06-28 DIAGNOSIS — Z79899 Other long term (current) drug therapy: Secondary | ICD-10-CM | POA: Insufficient documentation

## 2016-06-28 DIAGNOSIS — F10930 Alcohol use, unspecified with withdrawal, uncomplicated: Secondary | ICD-10-CM

## 2016-06-28 DIAGNOSIS — F1010 Alcohol abuse, uncomplicated: Secondary | ICD-10-CM | POA: Insufficient documentation

## 2016-06-28 DIAGNOSIS — F17210 Nicotine dependence, cigarettes, uncomplicated: Secondary | ICD-10-CM | POA: Insufficient documentation

## 2016-06-28 DIAGNOSIS — F909 Attention-deficit hyperactivity disorder, unspecified type: Secondary | ICD-10-CM | POA: Insufficient documentation

## 2016-06-28 DIAGNOSIS — F1910 Other psychoactive substance abuse, uncomplicated: Secondary | ICD-10-CM | POA: Insufficient documentation

## 2016-06-28 DIAGNOSIS — F10230 Alcohol dependence with withdrawal, uncomplicated: Secondary | ICD-10-CM | POA: Insufficient documentation

## 2016-06-28 LAB — CBC WITH DIFFERENTIAL/PLATELET
BASOS PCT: 1 %
Basophils Absolute: 0.1 10*3/uL (ref 0.0–0.1)
EOS ABS: 0.1 10*3/uL (ref 0.0–0.7)
EOS PCT: 1 %
HCT: 36 % (ref 36.0–46.0)
Hemoglobin: 12.3 g/dL (ref 12.0–15.0)
LYMPHS ABS: 2.4 10*3/uL (ref 0.7–4.0)
Lymphocytes Relative: 41 %
MCH: 30.4 pg (ref 26.0–34.0)
MCHC: 34.2 g/dL (ref 30.0–36.0)
MCV: 88.9 fL (ref 78.0–100.0)
MONO ABS: 0.5 10*3/uL (ref 0.1–1.0)
MONOS PCT: 9 %
Neutro Abs: 2.7 10*3/uL (ref 1.7–7.7)
Neutrophils Relative %: 48 %
PLATELETS: 301 10*3/uL (ref 150–400)
RBC: 4.05 MIL/uL (ref 3.87–5.11)
RDW: 13.7 % (ref 11.5–15.5)
WBC: 5.8 10*3/uL (ref 4.0–10.5)

## 2016-06-28 LAB — COMPREHENSIVE METABOLIC PANEL
ALT: 21 U/L (ref 14–54)
ANION GAP: 8 (ref 5–15)
AST: 36 U/L (ref 15–41)
Albumin: 4 g/dL (ref 3.5–5.0)
Alkaline Phosphatase: 82 U/L (ref 38–126)
BUN: 10 mg/dL (ref 6–20)
CHLORIDE: 101 mmol/L (ref 101–111)
CO2: 25 mmol/L (ref 22–32)
CREATININE: 0.62 mg/dL (ref 0.44–1.00)
Calcium: 8.9 mg/dL (ref 8.9–10.3)
Glucose, Bld: 92 mg/dL (ref 65–99)
POTASSIUM: 3.9 mmol/L (ref 3.5–5.1)
SODIUM: 134 mmol/L — AB (ref 135–145)
Total Bilirubin: 0.2 mg/dL — ABNORMAL LOW (ref 0.3–1.2)
Total Protein: 6.9 g/dL (ref 6.5–8.1)

## 2016-06-28 LAB — RAPID URINE DRUG SCREEN, HOSP PERFORMED
Amphetamines: NOT DETECTED
BENZODIAZEPINES: NOT DETECTED
Barbiturates: NOT DETECTED
COCAINE: POSITIVE — AB
OPIATES: NOT DETECTED
TETRAHYDROCANNABINOL: NOT DETECTED

## 2016-06-28 LAB — ETHANOL

## 2016-06-28 MED ORDER — LORAZEPAM 2 MG/ML IJ SOLN
0.0000 mg | Freq: Four times a day (QID) | INTRAMUSCULAR | Status: DC
  Administered 2016-06-28: 2 mg via INTRAVENOUS
  Filled 2016-06-28: qty 1

## 2016-06-28 MED ORDER — CHLORDIAZEPOXIDE HCL 25 MG PO CAPS: ORAL_CAPSULE | ORAL | 0 refills | Status: DC

## 2016-06-28 MED ORDER — LORAZEPAM 2 MG/ML IJ SOLN: 0.0000 mg | Freq: Two times a day (BID) | INTRAMUSCULAR | Status: DC

## 2016-06-28 MED ORDER — CHLORDIAZEPOXIDE HCL 25 MG PO CAPS
25.0000 mg | ORAL_CAPSULE | Freq: Once | ORAL | Status: AC
Start: 2016-06-28 — End: 2016-06-28
  Administered 2016-06-28: 25 mg via ORAL
  Filled 2016-06-28: qty 1

## 2016-06-28 MED ORDER — SODIUM CHLORIDE 0.9 % IV BOLUS (SEPSIS)
1000.0000 mL | Freq: Once | INTRAVENOUS | Status: AC
  Administered 2016-06-28: 1000 mL via INTRAVENOUS

## 2016-06-28 NOTE — ED Provider Notes (Signed)
[facility] DEPT Provider Note   CSN: [id number] Arrival date & time: 06/28/16  0041  By signing my name below, I, Modena JanskyAlbert Thayil, attest that this documentation has been prepared under the direction and in the presence of non-physician practitioner, Elpidio AnisShari Maecy Podgurski, PA-C. Electronically Signed: Modena JanskyAlbert Thayil, Scribe. 06/28/2016. 1:21 AM.  History   Chief Complaint Chief Complaint  Patient presents with  . alcohol detox   The history is provided by the patient. No language interpreter was used.   HPI Comments: Brittany Sanford is a 56 y.o. female with a PMHx of Bipoloar 1 disorder, ADHD, and substance abuse who presents to the Emergency Department for alcohol detox. She states she was seen on 06/27/16 in the ED for the same complaint, discharged, and [facility]. She last had alcohol 4 hours ago. She last tried to detox 3 years ago. She was sober for the past year, but relapsed due to the recent death of her mother. She denies any other complaints.    Past Medical History:  Diagnosis Date  . ADHD (attention deficit hyperactivity disorder)   . Attention deficit disorder   . Bipolar 1 disorder (HCC)   . Bronchitis   . Substance abuse     Patient Active Problem List   Diagnosis Date Noted  . Pyelonephritis 09/06/2014  . SOB (shortness of breath) 09/06/2014  . History of bipolar disorder 04/15/2014  . Alcohol use disorder, severe, dependence (HCC) 04/14/2014  . Bipolar affective disorder (HCC) 04/14/2014  . Substance induced mood disorder (HCC) 04/14/2014  . Alcohol abuse   . Polysubstance abuse     History reviewed. No pertinent surgical history.  OB History    Gravida Para Term Preterm AB Living   2 2           SAB TAB Ectopic Multiple Live Births                   Home Medications    Prior to Admission medications   Medication Sig Start Date End Date Taking? Authorizing Provider  albuterol (PROVENTIL  HFA;VENTOLIN HFA) 108 (90 Base) MCG/ACT inhaler Inhale 2 puffs into the lungs every 4 (four) hours as needed for wheezing or shortness of breath. 05/18/16  Yes Irean HongJade J Sung, MD  diphenhydrAMINE (BENADRYL) 25 MG tablet Take 25 mg by mouth every 6 (six) hours as needed for itching.    Yes Historical Provider, MD  ibuprofen (ADVIL,MOTRIN) 200 MG tablet Take 200 mg by mouth every 6 (six) hours as needed for moderate pain.    Yes Historical Provider, MD  HYDROcodone-acetaminophen (NORCO) 5-325 MG tablet Take 1 tablet by mouth every 6 (six) hours as needed for moderate pain. Patient not taking: Reported on 06/28/2016 05/18/16   Irean HongJade J Sung, MD  ondansetron (ZOFRAN ODT) 4 MG disintegrating tablet Take 1 tablet (4 mg total) by mouth every 8 (eight) hours as needed for nausea or vomiting. Patient not taking: Reported on 06/28/2016 05/18/16   Irean HongJade J Sung, MD    Family History Family History  Problem Relation Age of Onset  . COPD Mother   . Cancer Father     Social History Social History  Substance Use Topics  . Smoking status: Current Every Day Smoker    Packs/day: 1.00    Types: Cigarettes    Last attempt to quit: 01/22/2015  . Smokeless tobacco: Never Used  . Alcohol use Yes  Comment: 12 pack daily     Allergies   Darvocet [propoxyphene n-acetaminophen]   Review of Systems Review of Systems  Constitutional: Negative for diaphoresis and fever.  Respiratory: Negative for shortness of breath.   Cardiovascular: Negative for chest pain.  Gastrointestinal: Negative for abdominal pain and vomiting.  Neurological: Positive for tremors. Negative for seizures.     Physical Exam Updated Vital Signs BP 123/75 (BP Location: Right Arm)   Pulse 96   Temp 98.1 F (36.7 C) (Oral)   Resp 18   Ht 4\' 11"  (1.499 m)   Wt 113 lb 6.4 oz (51.4 kg)   SpO2 96%   BMI 22.90 kg/m   Physical Exam  Constitutional: She is oriented to person, place, and time. She appears well-developed and well-nourished.  No distress.  HENT:  Head: Normocephalic.  Oral mucosa is dry.   Eyes: Conjunctivae are normal.  Neck: Neck supple.  Cardiovascular: Normal rate and regular rhythm.   Pulmonary/Chest: Effort normal. No respiratory distress.  Rhonchi bilaterally but has full air movement.   Abdominal: Soft.  Musculoskeletal: Normal range of motion.  Neurological: She is alert and oriented to person, place, and time.  Tremulousness. Very alert and oriented.   Skin: Skin is warm and dry.  Psychiatric: She has a normal mood and affect.  Nursing note and vitals reviewed.    ED Treatments / Results  DIAGNOSTIC STUDIES: Oxygen Saturation is 96% on RA, normal by my interpretation.    COORDINATION OF CARE: 1:25 AM- Pt advised of plan for treatment and pt agrees.  Labs (all labs ordered are listed, but only abnormal results are displayed) Labs Reviewed  CBC WITH DIFFERENTIAL/PLATELET  ETHANOL  COMPREHENSIVE METABOLIC PANEL  RAPID URINE DRUG SCREEN, HOSP PERFORMED   Results for orders placed or performed during the hospital encounter of 06/28/16  CBC with Differential  Result Value Ref Range   WBC 5.8 4.0 - 10.5 K/uL   RBC 4.05 3.87 - 5.11 MIL/uL   Hemoglobin 12.3 12.0 - 15.0 g/dL   HCT 16.1 09.6 - 04.5 %   MCV 88.9 78.0 - 100.0 fL   MCH 30.4 26.0 - 34.0 pg   MCHC 34.2 30.0 - 36.0 g/dL   RDW 40.9 81.1 - 91.4 %   Platelets 301 150 - 400 K/uL   Neutrophils Relative % 48 %   Neutro Abs 2.7 1.7 - 7.7 K/uL   Lymphocytes Relative 41 %   Lymphs Abs 2.4 0.7 - 4.0 K/uL   Monocytes Relative 9 %   Monocytes Absolute 0.5 0.1 - 1.0 K/uL   Eosinophils Relative 1 %   Eosinophils Absolute 0.1 0.0 - 0.7 K/uL   Basophils Relative 1 %   Basophils Absolute 0.1 0.0 - 0.1 K/uL  Ethanol  Result Value Ref Range   Alcohol, Ethyl (B) <5 <5 mg/dL  Comprehensive metabolic panel  Result Value Ref Range   Sodium 134 (L) 135 - 145 mmol/L   Potassium 3.9 3.5 - 5.1 mmol/L   Chloride 101 101 - 111 mmol/L   CO2 25  22 - 32 mmol/L   Glucose, Bld 92 65 - 99 mg/dL   BUN 10 6 - 20 mg/dL   Creatinine, Ser 7.82 0.44 - 1.00 mg/dL   Calcium 8.9 8.9 - 95.6 mg/dL   Total Protein 6.9 6.5 - 8.1 g/dL   Albumin 4.0 3.5 - 5.0 g/dL   AST 36 15 - 41 U/L   ALT 21 14 - 54 U/L   Alkaline Phosphatase  82 38 - 126 U/L   Total Bilirubin 0.2 (L) 0.3 - 1.2 mg/dL   GFR calc non Af Amer >60 >60 mL/min   GFR calc Af Amer >60 >60 mL/min   Anion gap 8 5 - 15  Urine rapid drug screen (hosp performed)  Result Value Ref Range   Opiates NONE DETECTED NONE DETECTED   Cocaine POSITIVE (A) NONE DETECTED   Benzodiazepines NONE DETECTED NONE DETECTED   Amphetamines NONE DETECTED NONE DETECTED   Tetrahydrocannabinol NONE DETECTED NONE DETECTED   Barbiturates NONE DETECTED NONE DETECTED    EKG  EKG Interpretation None       Radiology No results found.  Procedures Procedures (including critical care time)  Medications Ordered in ED Medications  sodium chloride 0.9 % bolus 1,000 mL (not administered)  LORazepam (ATIVAN) injection 0-4 mg (not administered)    Followed by  LORazepam (ATIVAN) injection 0-4 mg (not administered)     Initial Impression / Assessment and Plan / ED Course  I have reviewed the triage vital signs and the nursing notes.  Pertinent labs & imaging results that were available during my care of the patient were reviewed by me and considered in my medical decision making (see chart for details).     Patient presents from [facility] with concern for alcohol withdrawal. She was initially seen in the ED and evaluated by Burna Forts, PA-C, who involved social worker in her care plan. She was provided resources for outpatient alcohol and drug treatment follow up as well as shelters and food sources.   She went to [facility] for evaluation of inpatient detox and was found to be tremulous and sent back to the ED for concern of withdrawal. Here she has been awake, alert. Initially tremulous that resolved with Ativan x  1. Fluids provided. She has not been tachycardic, significantly hypertensive, has not been delusional or had active hallucinations, but has remained lucent and oriented.   She can be discharged with Librium and encouraged to follow up with resources provided for outpatient care of substance abuse. She has been ambulatory, has eaten and has been drinking here without difficulty.  Final Clinical Impressions(s) / ED Diagnoses   Final diagnoses:  None   1. Polysubstance abuse 2. Uncomplicated alcohol withdrawal  New Prescriptions New Prescriptions   No medications on file   I personally performed the services described in this documentation, which was scribed in my presence. The recorded information has been reviewed and is accurate.      Elpidio Anis, PA-C 06/28/16 0441    Dione Booze, MD 06/28/16 [phone]

## 2016-06-28 NOTE — ED Provider Notes (Signed)
[facility] DEPT Provider Note   CSN: [id number] Arrival date & time: 06/28/16  1136     History   Chief Complaint Chief Complaint  Patient presents with  . Tremors  . Hip Pain    HPI Brittany Sanford is a 56 y.o. female.  HPI  Patient presents with request for assistance with alcohol abuse help.  Notably, the patient was here yesterday for similar concerns. Patient acknowledges other psychiatric issues, but essentially states that she has been using alcohol, cocaine recently, wants to have help. On discharge yesterday, the patient went to our behavioral health facility, she states that upon arrival there she was referred back to this facility with concern for seizure, possible complication withdrawal. Since returning here, no additional alcohol intake, no frank seizure, no confusion, no disorientation, no new pain, though she notes that for the past months, possibly year she has a pain in her right arm, right hip. She also denies a fall, new trauma.     Past Medical History:  Diagnosis Date  . ADHD (attention deficit hyperactivity disorder)   . Attention deficit disorder   . Bipolar 1 disorder (HCC)   . Bronchitis   . Substance abuse     Patient Active Problem List   Diagnosis Date Noted  . Pyelonephritis 09/06/2014  . SOB (shortness of breath) 09/06/2014  . History of bipolar disorder 04/15/2014  . Alcohol use disorder, severe, dependence (HCC) 04/14/2014  . Bipolar affective disorder (HCC) 04/14/2014  . Substance induced mood disorder (HCC) 04/14/2014  . Alcohol abuse   . Polysubstance abuse     History reviewed. No pertinent surgical history.  OB History    Gravida Para Term Preterm AB Living   2 2           SAB TAB Ectopic Multiple Live Births                   Home Medications    Prior to Admission medications   Medication Sig Start Date End Date Taking? Authorizing Provider  acetaminophen (TYLENOL) 500 MG tablet Take 500 mg by mouth every 6  (six) hours as needed for mild pain, moderate pain, fever or headache.   Yes Historical Provider, MD  albuterol (PROVENTIL HFA;VENTOLIN HFA) 108 (90 Base) MCG/ACT inhaler Inhale 2 puffs into the lungs every 4 (four) hours as needed for wheezing or shortness of breath. 05/18/16  Yes Irean HongJade J Sung, MD  diphenhydrAMINE (BENADRYL) 25 MG tablet Take 25 mg by mouth every 6 (six) hours as needed for itching.    Yes Historical Provider, MD  ibuprofen (ADVIL,MOTRIN) 200 MG tablet Take 200 mg by mouth every 6 (six) hours as needed for moderate pain.    Yes Historical Provider, MD  chlordiazePOXIDE (LIBRIUM) 25 MG capsule 50mg  PO TID x 1D, then 25-50mg  PO BID X 1D, then 25-50mg  PO QD X 1D Patient not taking: Reported on 06/28/2016 06/28/16   Elpidio AnisShari Upstill, PA-C  HYDROcodone-acetaminophen (NORCO) 5-325 MG tablet Take 1 tablet by mouth every 6 (six) hours as needed for moderate pain. Patient not taking: Reported on 06/28/2016 05/18/16   Irean HongJade J Sung, MD  ondansetron (ZOFRAN ODT) 4 MG disintegrating tablet Take 1 tablet (4 mg total) by mouth every 8 (eight) hours as needed for nausea or vomiting. Patient not taking: Reported on 06/28/2016 05/18/16   Irean HongJade J Sung, MD    Family History Family History  Problem Relation Age of Onset  . COPD Mother   . Cancer Father  Social History Social History  Substance Use Topics  . Smoking status: Current Every Day Smoker    Packs/day: 1.00    Types: Cigarettes    Last attempt to quit: 01/22/2015  . Smokeless tobacco: Never Used  . Alcohol use Yes     Comment: 12 pack daily     Allergies   Darvocet [propoxyphene n-acetaminophen]   Review of Systems Review of Systems  Constitutional:       Per HPI, otherwise negative  HENT:       Per HPI, otherwise negative  Respiratory:       Per HPI, otherwise negative  Cardiovascular:       Per HPI, otherwise negative  Gastrointestinal: Negative for vomiting.  Endocrine:       Negative aside from HPI  Genitourinary:        Neg aside from HPI   Musculoskeletal:       Per HPI, otherwise negative  Skin: Negative.   Allergic/Immunologic: Negative for immunocompromised state.  Neurological: Positive for weakness. Negative for syncope.  Psychiatric/Behavioral: Positive for dysphoric mood. The patient is nervous/anxious.      Physical Exam Updated Vital Signs BP 122/85 (BP Location: Right Arm)   Pulse 98   Temp 97.7 F (36.5 C) (Oral)   Resp 17   Ht 4\' 11"  (1.499 m)   Wt 113 lb 6.4 oz (51.4 kg)   SpO2 92%   BMI 22.90 kg/m   Physical Exam  Constitutional: She is oriented to person, place, and time. No distress.  Frail-appearing female awake, alert, resting in left lateral decubitus position  HENT:  Head: Normocephalic and atraumatic.  Eyes: Conjunctivae and EOM are normal.  Cardiovascular: Normal rate and regular rhythm.   Pulmonary/Chest: Effort normal and breath sounds normal. No stridor. No respiratory distress.  Abdominal: She exhibits no distension.  Musculoskeletal: She exhibits no edema or deformity.  The patient describes pain in her right arm, right hip, no appreciable deformity, she moves both extremities spontaneously, no overt evidence for infection  Neurological: She is alert and oriented to person, place, and time. She displays atrophy. She displays no tremor. No cranial nerve deficit. She displays no seizure activity. Coordination normal.  Patient shakes when I ask her about her concerns, efforts to proceed to an alcohol rehabilitation program. However, the shaking stops, when she is speaking, when she is being directly addressed, when she is focusing on some activity.   Skin: Skin is warm and dry.  Psychiatric: She has a normal mood and affect.  Nursing note and vitals reviewed.    ED Treatments / Results   Labs, studies, performed within the past 24 hours, no indication for additional imaging, labs. I reviewed the patient's case with our social work team, our behavioral health  team, given the course of events. Has a spoke with our case management services about obtaining assistance for medications.  On repeat exam the patient is in similar condition, anxious, but in no distress, with no evidence for Acute withdrawal. I discussed the importance of following up in a detox/rehabilitation center given the absence of ongoing evidence for Acute withdrawal. I also advocated for the patient to proceed to our affiliated behavioral Health Center where they can't provide assistance with benzodiazepine prescriptions.   Procedures Procedures (including critical care time)  Medications Ordered in ED Medications  chlordiazePOXIDE (LIBRIUM) capsule 25 mg (25 mg Oral Given 06/28/16 1247)     Initial Impression / Assessment and Plan / ED Course  I have  reviewed the triage vital signs and the nursing notes.  Pertinent labs & imaging results that were available during my care of the patient were reviewed by me and considered in my medical decision making (see chart for details).   Final Clinical Impressions(s) / ED Diagnoses  Alcohol addiction   Gerhard Munch, MD 06/28/16 1454

## 2016-06-28 NOTE — ED Notes (Signed)
This Clinical research associatewriter and EDP attempted to explain the importance of taking her prescriptions and following-up with the provided outpatient resources.  Pt continually sts she needs rehab and we continued to try to explain that she needs to follow-up with her resources.  Pt seems to have a barrier to learning.

## 2016-06-28 NOTE — H&P (Signed)
Behavioral Health Medical Screening Exam  Brittany Sanford is an 56 y.o. female, requesting alcohol and cocaine detoxification. She is denying any depression SI/SA or HI. her last drink was three and a half hours ago (12 pack) of beer and did 20 dollars worth of cocaine around 3 pm today. [facility]  Total Time spent with patient: 1 hour  Psychiatric Specialty Exam: Physical Exam  Constitutional: She is oriented to person, place, and time. She appears well-developed and well-nourished. No distress.  HENT:  Head: Normocephalic.  Eyes: Pupils are equal, round, and reactive to light.  Respiratory: Effort normal and breath sounds normal.  Neurological: She is alert and oriented to person, place, and time. No cranial nerve deficit.  Skin: Skin is warm and dry. She is not diaphoretic.  Psychiatric: Judgment normal. Her mood appears anxious. Her speech is slurred. She is agitated. Cognition and memory are normal. She expresses no suicidal plans and no homicidal plans.    Review of Systems  Psychiatric/Behavioral: Positive for substance abuse. Negative for depression and suicidal ideas. The patient is nervous/anxious and has insomnia.   All other systems reviewed and are negative.   There were no vitals taken for this visit.There is no height or weight on file to calculate BMI.  General Appearance: Disheveled  Eye Contact:  Poor  Speech:  Garbled  Volume:  Normal  Mood:  Anxious  Affect:  Full Range  Thought Process:  Goal Directed  Orientation:  Full (Time, Place, and Person)  Thought Content:  Negative  Suicidal Thoughts:  No  Homicidal Thoughts:  No  Memory:  Immediate;   Fair  Judgement:  Impaired  Insight:  Lacking  Psychomotor Activity:  Increased  Concentration: Concentration: Poor  Recall:  [employer]is[employer]Fair  Fund of Knowledge:Poor  Language: Fair  Akathisia:  Negative  Handed:  Right  AIMS (if indicated):     Assets:  Desire for Improvement   Sleep:       Musculoskeletal: Strength & Muscle Tone: within normal limits Gait & Station: unsteady Patient leans: Backward  There were no vitals taken for this visit.  Recommendations:  Based on my evaluation the patient appears to have an emergency medical condition for which I recommend the patient be transferred to the emergency department for further evaluation.  Latonya Nelon E, PA-C 06/28/2016, 3:13 AM

## 2016-06-28 NOTE — ED Triage Notes (Signed)
Pt presents after sitting in lobby all night.  C/o ETOH withdrawal and R hip and R arm pain.  Pain score 10/10.  Pt discharged this morning after being seen for same.  Sts she is "feeling spiders crawling, shaking, and feeling hot and cold."  Last drink around 9pm.

## 2016-06-28 NOTE — ED Triage Notes (Addendum)
Pt was recently seen and dc'ed. [facility] and shown how to get there. Pt got there and was told she was not properly medically cleared. [facility] pt is going into dts and needs further treatment. Last ETOH consumption was 3 hours ago.  Pt is tremulous during triage, but she stops shaking when she answers questions 20 G L forearm, 250 NS

## 2016-06-28 NOTE — BHH Counselor (Addendum)
[facility]  TTS consult requested for pt. Clinician reviewed pt's chart for the past 24 hours. [facility] requesting detox and rehab for alcohol and cocaine abuse. She denied SI/HI. SW was consulted and as SW was helping pt with d/c planning, pt rec'd a telephone call, [facility], requesting she go there for an assessment [facility]. [facility], again request alcohol/drug detox and denying SI/HI/AVH. [facility] was aware of pt being advised to come there for an assessment and when TTS asked, pt disclosed that the call she rec'd [facility] was from her sister. While at [facility], pt began to shake uncontrollably and indicated that she thought she was going into withdrawals. [facility] of suspected [facility]. [facility] and determined not to be going into DTs. She was given resources by SW for alcohol and drug treatment, as well as for shelters and subsequently d/c. Pt remained in the ED lobby for the rest of the night and presented again this morning to triage for ETOH withdrawal. No SI/HI. Pt continues to say she needs rehab.   Spoke to [employer] about pt's case and he agreed that a TTS consult was not needed at this time. He will call TTS if that there are any changes that require a TTS consult.   Johny ShockSamantha M. Ladona Ridgelaylor, MS, NCC, LPCA Counselor
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
## 2016-06-28 NOTE — Discharge Instructions (Signed)
You were given resources for alcohol and drug treatment programs as well as shelters and food sources when seen earlier today. Follow up with these for continued treatment. Take Librium as directed for symptoms of alcohol withdrawal.

## 2016-06-28 NOTE — Discharge Instructions (Signed)
Please use the provided resources to proceed to a alcohol rehabilitation facility. If you need assistance obtaining your prescribed medication, please go to [facility], they will be able to assist with resources.

## 2017-01-17 DIAGNOSIS — F1310 Sedative, hypnotic or anxiolytic abuse, uncomplicated: Secondary | ICD-10-CM | POA: Insufficient documentation

## 2017-01-24 ENCOUNTER — Emergency Department (HOSPITAL_COMMUNITY): Payer: Self-pay

## 2017-01-24 ENCOUNTER — Encounter (HOSPITAL_COMMUNITY): Payer: Self-pay | Admitting: Emergency Medicine

## 2017-01-24 ENCOUNTER — Emergency Department (HOSPITAL_COMMUNITY)
Admission: EM | Admit: 2017-01-24 | Discharge: 2017-01-25 | Disposition: A | Discharge status: Home / Self Care | Payer: Self-pay | Attending: Emergency Medicine | Admitting: Emergency Medicine

## 2017-01-24 DIAGNOSIS — M5416 Radiculopathy, lumbar region: Secondary | ICD-10-CM | POA: Insufficient documentation

## 2017-01-24 DIAGNOSIS — W19XXXA Unspecified fall, initial encounter: Secondary | ICD-10-CM

## 2017-01-24 DIAGNOSIS — M25551 Pain in right hip: Secondary | ICD-10-CM | POA: Insufficient documentation

## 2017-01-24 DIAGNOSIS — F17210 Nicotine dependence, cigarettes, uncomplicated: Secondary | ICD-10-CM | POA: Insufficient documentation

## 2017-01-24 LAB — URINALYSIS, ROUTINE W REFLEX MICROSCOPIC
Bilirubin Urine: NEGATIVE
GLUCOSE, UA: NEGATIVE mg/dL
HGB URINE DIPSTICK: NEGATIVE
Ketones, ur: NEGATIVE mg/dL
Leukocytes, UA: NEGATIVE
Nitrite: NEGATIVE
Protein, ur: NEGATIVE mg/dL
SPECIFIC GRAVITY, URINE: 1.004 — AB (ref 1.005–1.030)
pH: 6 (ref 5.0–8.0)

## 2017-01-24 LAB — CBC WITH DIFFERENTIAL/PLATELET
BASOS ABS: 0.1 10*3/uL (ref 0.0–0.1)
BASOS PCT: 1 %
Eosinophils Absolute: 0.1 10*3/uL (ref 0.0–0.7)
Eosinophils Relative: 1 %
HEMATOCRIT: 41.4 % (ref 36.0–46.0)
Hemoglobin: 13.7 g/dL (ref 12.0–15.0)
Lymphocytes Relative: 33 %
Lymphs Abs: 2 10*3/uL (ref 0.7–4.0)
MCH: 30.7 pg (ref 26.0–34.0)
MCHC: 33.1 g/dL (ref 30.0–36.0)
MCV: 92.8 fL (ref 78.0–100.0)
MONO ABS: 0.7 10*3/uL (ref 0.1–1.0)
Monocytes Relative: 12 %
NEUTROS ABS: 3.1 10*3/uL (ref 1.7–7.7)
Neutrophils Relative %: 53 %
Platelets: 264 10*3/uL (ref 150–400)
RBC: 4.46 MIL/uL (ref 3.87–5.11)
RDW: 13.1 % (ref 11.5–15.5)
WBC: 6 10*3/uL (ref 4.0–10.5)

## 2017-01-24 LAB — BASIC METABOLIC PANEL
ANION GAP: 9 (ref 5–15)
BUN: 8 mg/dL (ref 6–20)
CALCIUM: 8.7 mg/dL — AB (ref 8.9–10.3)
CO2: 25 mmol/L (ref 22–32)
Chloride: 98 mmol/L — ABNORMAL LOW (ref 101–111)
Creatinine, Ser: 0.65 mg/dL (ref 0.44–1.00)
Glucose, Bld: 90 mg/dL (ref 65–99)
Potassium: 3.3 mmol/L — ABNORMAL LOW (ref 3.5–5.1)
SODIUM: 132 mmol/L — AB (ref 135–145)

## 2017-01-24 LAB — RAPID URINE DRUG SCREEN, HOSP PERFORMED
Amphetamines: NOT DETECTED
BENZODIAZEPINES: NOT DETECTED
Barbiturates: POSITIVE — AB
Cocaine: NOT DETECTED
OPIATES: NOT DETECTED
Tetrahydrocannabinol: NOT DETECTED

## 2017-01-24 MED ORDER — LIDOCAINE 5 % EX PTCH
1.0000 | MEDICATED_PATCH | CUTANEOUS | Status: DC
  Administered 2017-01-24: 1 via TRANSDERMAL
  Filled 2017-01-24: qty 1

## 2017-01-24 MED ORDER — LIDOCAINE 5 % EX PTCH: 1.0000 | MEDICATED_PATCH | CUTANEOUS | 0 refills | Status: DC

## 2017-01-24 MED ORDER — PREDNISONE 20 MG PO TABS: 40.0000 mg | ORAL_TABLET | Freq: Every day | ORAL | 0 refills | Status: DC

## 2017-01-24 MED ORDER — DEXAMETHASONE SODIUM PHOSPHATE 10 MG/ML IJ SOLN
10.0000 mg | Freq: Once | INTRAMUSCULAR | Status: AC
  Administered 2017-01-24: 10 mg via INTRAVENOUS
  Filled 2017-01-24: qty 1

## 2017-01-24 MED ORDER — LORAZEPAM 2 MG/ML IJ SOLN
0.5000 mg | Freq: Once | INTRAMUSCULAR | Status: AC
  Administered 2017-01-24: 0.5 mg via INTRAVENOUS
  Filled 2017-01-24: qty 1

## 2017-01-24 MED ORDER — KETOROLAC TROMETHAMINE 30 MG/ML IJ SOLN
30.0000 mg | Freq: Once | INTRAMUSCULAR | Status: AC
  Administered 2017-01-24: 30 mg via INTRAVENOUS
  Filled 2017-01-24: qty 1

## 2017-01-24 NOTE — ED Notes (Signed)
Family at bedside. 

## 2017-01-24 NOTE — ED Provider Notes (Signed)
[facility] EMERGENCY DEPARTMENT Provider Note   CSN: [id number] Arrival date & time: 01/24/17  1521    History   Chief Complaint Chief Complaint  Patient presents with  . Back Pain    HPI Brittany Sanford is a 56 y.o. female.  56 year old female with a history of ADHD, ADD, bipolar 1 disorder, and alcohol abuse presents to the emergency department for evaluation of back pain. Patient states that she has been experiencing right-sided low back and hip pain since a fall 3 weeks ago down 7 stairs. Patient states that pain is fairly constant and has been associated with some subjective numbness to the lateral right thigh. Pain is also radiated down her right leg at times. Pain is aggravated with ambulation and unrelieved with OTC BC powders. Patient further reports episodes of urinary incontinence, though this was at a time the patient sneezed. She has not experienced bowel incontinence. No genital numbness, fever. Patient endorses a hx of scoliosis.      Past Medical History:  Diagnosis Date  . ADHD (attention deficit hyperactivity disorder)   . Attention deficit disorder   . Bipolar 1 disorder (HCC)   . Bronchitis   . Substance abuse [facility])     Patient Active Problem List   Diagnosis Date Noted  . Pyelonephritis 09/06/2014  . SOB (shortness of breath) 09/06/2014  . History of bipolar disorder 04/15/2014  . Alcohol use disorder, severe, dependence (HCC) 04/14/2014  . Bipolar affective disorder (HCC) 04/14/2014  . Substance induced mood disorder (HCC) 04/14/2014  . Alcohol abuse   . Polysubstance abuse (HCC)     History reviewed. No pertinent surgical history.  OB History    Gravida Para Term Preterm AB Living   2 2           SAB TAB Ectopic Multiple Live Births                   Home Medications    Prior to Admission medications   Medication Sig Start Date End Date Taking? Authorizing Provider  acetaminophen (TYLENOL) 500 MG tablet Take 500 mg  by mouth every 6 (six) hours as needed for mild pain, moderate pain, fever or headache.   Yes [provider]  albuterol (PROVENTIL HFA;VENTOLIN HFA) 108 (90 Base) MCG/ACT inhaler Inhale 2 puffs into the lungs every 4 (four) hours as needed for wheezing or shortness of breath. 05/18/16  Yes Irean Hong, MD  chlordiazePOXIDE (LIBRIUM) 25 MG capsule 50mg  PO TID x 1D, then 25-50mg  PO BID X 1D, then 25-50mg  PO QD X 1D Patient not taking: Reported on 06/28/2016 06/28/16   Elpidio Anis, PA-C  diphenhydrAMINE (BENADRYL) 25 MG tablet Take 25 mg by mouth every 6 (six) hours as needed for itching.     [provider]  HYDROcodone-acetaminophen (NORCO) 5-325 MG tablet Take 1 tablet by mouth every 6 (six) hours as needed for moderate pain. Patient not taking: Reported on 06/28/2016 05/18/16   Irean Hong, MD  ibuprofen (ADVIL,MOTRIN) 200 MG tablet Take 200 mg by mouth every 6 (six) hours as needed for moderate pain.     [provider]  lidocaine (LIDODERM) 5 % Place 1 patch onto the skin daily. Remove & Discard patch within 12 hours or as directed by MD 01/24/17   Antony Madura, PA-C  ondansetron (ZOFRAN ODT) 4 MG disintegrating tablet Take 1 tablet (4 mg total) by mouth every 8 (eight) hours as needed for nausea or vomiting. Patient  not taking: Reported on 06/28/2016 05/18/16   Irean Hong, MD  predniSONE (DELTASONE) 20 MG tablet Take 2 tablets (40 mg total) by mouth daily. Take 40 mg by mouth daily for 3 days, then 20mg  by mouth daily for 3 days, then 10mg  daily for 3 days 01/24/17   Antony Madura, PA-C    Family History Family History  Problem Relation Age of Onset  . COPD Mother   . Cancer Father     Social History Social History  Substance Use Topics  . Smoking status: Current Every Day Smoker    Packs/day: 1.00    Types: Cigarettes    Last attempt to quit: 01/22/2015  . Smokeless tobacco: Never Used  . Alcohol use Yes     Comment: 12 pack daily     Allergies     Darvocet [propoxyphene n-acetaminophen]   Review of Systems Review of Systems Ten systems reviewed and are negative for acute change, except as noted in the HPI.    Physical Exam Updated Vital Signs BP (!) 138/103   Pulse 94   Temp 98.7 F (37.1 C) (Oral)   Resp 17   SpO2 97%   Physical Exam  Constitutional: She is oriented to person, place, and time. She appears well-developed and well-nourished. No distress.  Nontoxic and in NAD  HENT:  Head: Normocephalic and atraumatic.  Eyes: Conjunctivae and EOM are normal. No scleral icterus.  Neck: Normal range of motion.  Cardiovascular: Normal rate, regular rhythm and intact distal pulses.   Pulmonary/Chest: Effort normal. No respiratory distress.  Respirations even and unlabored  Genitourinary:  Genitourinary Comments: Rectal tone intact.  Musculoskeletal: Normal range of motion.  No TTP to the lumbosacral midline. No bony step offs or crepitus. TTP noted to the right lumbar paraspinal muscles and posterior hip. No leg shortening of the RLE.  Neurological: She is alert and oriented to person, place, and time. Coordination normal.  Normal sensation in BLE. Ambulatory with antalgic gait; 1 hand assist. Patient able to wiggle all toes.  Skin: Skin is warm and dry. No rash noted. She is not diaphoretic. No erythema. No pallor.  Psychiatric: She has a normal mood and affect. Her behavior is normal.  Nursing note and vitals reviewed.    ED Treatments / Results  Labs (all labs ordered are listed, but only abnormal results are displayed) Labs Reviewed  BASIC METABOLIC PANEL - Abnormal; Notable for the following:       Result Value   Sodium 132 (*)    Potassium 3.3 (*)    Chloride 98 (*)    Calcium 8.7 (*)    All other components within normal limits  RAPID URINE DRUG SCREEN, HOSP PERFORMED - Abnormal; Notable for the following:    Barbiturates POSITIVE (*)    All other components within normal limits  URINALYSIS, ROUTINE W  REFLEX MICROSCOPIC - Abnormal; Notable for the following:    Color, Urine STRAW (*)    Specific Gravity, Urine 1.004 (*)    All other components within normal limits  CBC WITH DIFFERENTIAL/PLATELET    EKG  EKG Interpretation None       Radiology Mr Lumbar Spine Wo Contrast  Result Date: 01/24/2017 CLINICAL DATA:  56 y/o F; back pain with suspected cauda equina syndrome. EXAM: MRI LUMBAR SPINE WITHOUT CONTRAST TECHNIQUE: Multiplanar, multisequence MR imaging of the lumbar spine was performed. No intravenous contrast was administered. COMPARISON:  05/17/2016 CT abdomen and pelvis and 09/05/2014 CT of the chest abdomen  and pelvis. FINDINGS: Segmentation: On prior CT imaging: 12 right-sided ribs and 13 left-sided ribs. The right first rib is a bifid rib and originating from the first 2 thoracic vertebral bodies indicating 13 thoracic segments. 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 4 complete lumbar type non-rib-bearing vertebral bodies. Alignment: Mild S-shaped curvature of the lumbar spine. Normal lumbar lordosis without listhesis. Vertebrae:  No fracture, evidence of discitis, or bone lesion. Conus medullaris: Extends to the L1-2 level and appears normal. Paraspinal and other soft tissues: Negative. Disc levels: Right L1 hemivertebra with T12-L1 and L1-2 articulation on the right. T12 and L2 articulation on the left. The 2 right and single left articulations have a single intervening disc which is split on the right. The disc between the T12 and L2 articulation bulges into the extraforaminal zone with a large marginal osteophyte. Mild left T12/L2, right T12/L1, right L1/L2 foraminal stenosis. No significant canal stenosis. L2-3: Minimal disc bulge and mild facet hypertrophy. No significant foraminal or canal stenosis. L3-4: Small disc bulge with mild facet hypertrophy. Mild bilateral foraminal stenosis. No significant canal stenosis. L4-5: Moderate disc bulge with  facet and ligamentum flavum hypertrophy greater on the right. Moderate to severe right and mild left foraminal stenosis. Mild canal stenosis. L5-S1: Moderate disc bulge eccentric to the right with moderate right greater than left facet hypertrophy. Moderate to severe right and mild left foraminal stenosis. No significant canal stenosis. Right lateral recess effacement with contact on descending right S1 nerve root. IMPRESSION: 1. Right L1 hemivertebra. 2. Mild S-shaped curvature of lumbar spine. Normal lordosis without listhesis. 3. No acute osseous abnormality. 4. Mild L4-5 canal stenosis.  No high-grade canal stenosis. 5. Multilevel mild foraminal stenosis. Moderate to severe right L4-5 and L5-S1 foraminal stenosis. 6. Right L5-S1 lateral recess effacement with contact upon descending right S1 nerve root. Electronically Signed   By: Mitzi Hansen M.D.   On: 01/24/2017 23:17   Dg Hip Unilat W Or Wo Pelvis 2-3 Views Right  Result Date: 01/24/2017 CLINICAL DATA:  Right hip pain after falling 3 weeks ago EXAM: DG HIP (WITH OR WITHOUT PELVIS) 2-3V RIGHT COMPARISON:  None. FINDINGS: There is no evidence of hip fracture or dislocation. There is no evidence of arthropathy or other focal bone abnormality. IMPRESSION: Negative. Electronically Signed   By: Deatra Robinson M.D.   On: 01/24/2017 21:31    Procedures Procedures (including critical care time)  Medications Ordered in ED Medications  lidocaine (LIDODERM) 5 % 1 patch (1 patch Transdermal Patch Applied 01/24/17 2058)  ketorolac (TORADOL) 30 MG/ML injection 30 mg (30 mg Intravenous Given 01/24/17 2058)  dexamethasone (DECADRON) injection 10 mg (10 mg Intravenous Given 01/24/17 2058)  LORazepam (ATIVAN) injection 0.5 mg (0.5 mg Intravenous Given 01/24/17 2159)     Initial Impression / Assessment and Plan / ED Course  I have reviewed the triage vital signs and the nursing notes.  Pertinent labs & imaging results that were available during  my care of the patient were reviewed by me and considered in my medical decision making (see chart for details).     56 year old female presents to the emergency department for low back and hip pain after a fall 3 weeks ago. Sensation intact in bilateral lower extremities. Patient able to ambulate, but reports weakness intermittently in her right leg. She states that it "gives out" with prolonged ambulation.  Patient noting episodes of incontinence in triage, though this is clinically consistent with stress incontinence. Still, MRI was obtained  to rule out cauda equina in the setting of incontinence with associated numbness and weakness. MRI today shows findings consistent with a lumbar radiculopathy. There is moderate to severe right L4/5 and L5/1 foraminal stenosis. Right L5/S1 lateral recess effacement with contact upon the descending right S1 nerve root also noted. This is likely the cause of patient's pain and RLE weakness.  Plan to refer to neurosurgery for follow-up. Patient placed on a steroid taper. We'll provide Lidoderm patches as well. Narcotics and muscle relaxers avoided given history of polysubstance abuse with recent and early sobriety. Primary care follow-up advised in return precautions given. Patient discharged in stable condition with no unaddressed concerns.   Final Clinical Impressions(s) / ED Diagnoses   Final diagnoses:  Lumbar radiculopathy  Right hip pain  Fall, initial encounter    New Prescriptions New Prescriptions   LIDOCAINE (LIDODERM) 5 %    Place 1 patch onto the skin daily. Remove & Discard patch within 12 hours or as directed by MD   PREDNISONE (DELTASONE) 20 MG TABLET    Take 2 tablets (40 mg total) by mouth daily. Take 40 mg by mouth daily for 3 days, then 20mg  by mouth daily for 3 days, then 10mg  daily for 3 days     Antony MaduraHumes, Trisa Cranor, Cordelia Poche-C 01/24/17 2341    Rolland PorterJames, Mark, MD 01/25/17 [phone]

## 2017-01-24 NOTE — Discharge Instructions (Signed)
Take prednisone as prescribed to help improve your pain and weakness. We also recommend a Lidoderm patch for pain management. Follow-up with a neurosurgeon for additional management of your symptoms. You may return to the ED for new or concerning symptoms.

## 2017-01-24 NOTE — ED Notes (Addendum)
Pt called for triage no answer walked up to desk and said she has been waiting the whole time and no one called her name pt sitting in the triage area waiting to be triage

## 2017-01-24 NOTE — ED Triage Notes (Signed)
Pt to ER for evaluation of lower back pain; states hx of scoliosis and fell a few weeks ago down stairs. States "at times" can't feel the anterior part of her thighs, has pain radiating from lower back down right leg. States has been having incontinent episodes since last week. Pt is ambulatory. CMS intact at this time. A/o x4.

## 2017-02-06 ENCOUNTER — Emergency Department (HOSPITAL_COMMUNITY)
Admission: EM | Admit: 2017-02-06 | Discharge: 2017-02-06 | Disposition: A | Discharge status: Home / Self Care | Payer: Self-pay | Attending: Emergency Medicine | Admitting: Emergency Medicine

## 2017-02-06 ENCOUNTER — Emergency Department (HOSPITAL_COMMUNITY): Payer: Self-pay

## 2017-02-06 ENCOUNTER — Other Ambulatory Visit: Payer: Self-pay

## 2017-02-06 ENCOUNTER — Encounter (HOSPITAL_COMMUNITY): Payer: Self-pay

## 2017-02-06 DIAGNOSIS — Y92009 Unspecified place in unspecified non-institutional (private) residence as the place of occurrence of the external cause: Secondary | ICD-10-CM | POA: Insufficient documentation

## 2017-02-06 DIAGNOSIS — Z87891 Personal history of nicotine dependence: Secondary | ICD-10-CM | POA: Insufficient documentation

## 2017-02-06 DIAGNOSIS — R05 Cough: Secondary | ICD-10-CM | POA: Insufficient documentation

## 2017-02-06 DIAGNOSIS — W19XXXA Unspecified fall, initial encounter: Secondary | ICD-10-CM

## 2017-02-06 DIAGNOSIS — W01198A Fall on same level from slipping, tripping and stumbling with subsequent striking against other object, initial encounter: Secondary | ICD-10-CM | POA: Insufficient documentation

## 2017-02-06 DIAGNOSIS — S20211A Contusion of right front wall of thorax, initial encounter: Secondary | ICD-10-CM | POA: Insufficient documentation

## 2017-02-06 DIAGNOSIS — Y9389 Activity, other specified: Secondary | ICD-10-CM | POA: Insufficient documentation

## 2017-02-06 DIAGNOSIS — Y998 Other external cause status: Secondary | ICD-10-CM | POA: Insufficient documentation

## 2017-02-06 LAB — I-STAT CHEM 8, ED
BUN: 6 mg/dL (ref 6–20)
CHLORIDE: 95 mmol/L — AB (ref 101–111)
Calcium, Ion: 1.16 mmol/L (ref 1.15–1.40)
Creatinine, Ser: 0.7 mg/dL (ref 0.44–1.00)
Glucose, Bld: 97 mg/dL (ref 65–99)
HEMATOCRIT: 39 % (ref 36.0–46.0)
Hemoglobin: 13.3 g/dL (ref 12.0–15.0)
POTASSIUM: 3.6 mmol/L (ref 3.5–5.1)
SODIUM: 139 mmol/L (ref 135–145)
TCO2: 30 mmol/L (ref 22–32)

## 2017-02-06 LAB — CBC
HEMATOCRIT: 38.7 % (ref 36.0–46.0)
HEMOGLOBIN: 12.5 g/dL (ref 12.0–15.0)
MCH: 30.3 pg (ref 26.0–34.0)
MCHC: 32.3 g/dL (ref 30.0–36.0)
MCV: 93.7 fL (ref 78.0–100.0)
Platelets: 292 10*3/uL (ref 150–400)
RBC: 4.13 MIL/uL (ref 3.87–5.11)
RDW: 12.6 % (ref 11.5–15.5)
WBC: 8.1 10*3/uL (ref 4.0–10.5)

## 2017-02-06 LAB — BASIC METABOLIC PANEL
ANION GAP: 9 (ref 5–15)
BUN: 5 mg/dL — ABNORMAL LOW (ref 6–20)
CO2: 31 mmol/L (ref 22–32)
Calcium: 9.2 mg/dL (ref 8.9–10.3)
Chloride: 97 mmol/L — ABNORMAL LOW (ref 101–111)
Creatinine, Ser: 0.73 mg/dL (ref 0.44–1.00)
GFR calc Af Amer: >60 mL/min (ref >60)
GFR calc non Af Amer: >60 mL/min (ref >60)
GLUCOSE: 101 mg/dL — AB (ref 65–99)
POTASSIUM: 3.5 mmol/L (ref 3.5–5.1)
Sodium: 137 mmol/L (ref 135–145)

## 2017-02-06 MED ORDER — ACETAMINOPHEN 500 MG PO TABS
1000.0000 mg | ORAL_TABLET | Freq: Once | ORAL | Status: AC
  Administered 2017-02-06: 1000 mg via ORAL
  Filled 2017-02-06: qty 2

## 2017-02-06 MED ORDER — AEROCHAMBER PLUS FLO-VU LARGE MISC
1.0000 | Freq: Once | Status: AC
  Administered 2017-02-06: 1

## 2017-02-06 MED ORDER — KETOROLAC TROMETHAMINE 30 MG/ML IJ SOLN
15.0000 mg | Freq: Once | INTRAMUSCULAR | Status: AC
  Administered 2017-02-06: 15 mg via INTRAVENOUS
  Filled 2017-02-06: qty 1

## 2017-02-06 MED ORDER — DEXAMETHASONE SODIUM PHOSPHATE 10 MG/ML IJ SOLN
10.0000 mg | Freq: Once | INTRAMUSCULAR | Status: AC
  Administered 2017-02-06: 10 mg via INTRAVENOUS
  Filled 2017-02-06: qty 1

## 2017-02-06 MED ORDER — IOPAMIDOL (ISOVUE-370) INJECTION 76%
INTRAVENOUS | Status: AC
  Administered 2017-02-06: 100 mL
  Filled 2017-02-06: qty 100

## 2017-02-06 MED ORDER — ALBUTEROL SULFATE (2.5 MG/3ML) 0.083% IN NEBU
5.0000 mg | INHALATION_SOLUTION | Freq: Once | RESPIRATORY_TRACT | Status: AC
  Administered 2017-02-06: 5 mg via RESPIRATORY_TRACT
  Filled 2017-02-06: qty 6

## 2017-02-06 MED ORDER — IPRATROPIUM-ALBUTEROL 0.5-2.5 (3) MG/3ML IN SOLN
3.0000 mL | Freq: Once | RESPIRATORY_TRACT | Status: AC
  Administered 2017-02-06: 3 mL via RESPIRATORY_TRACT
  Filled 2017-02-06: qty 3

## 2017-02-06 MED ORDER — MELOXICAM 15 MG PO TABS: 15.0000 mg | ORAL_TABLET | Freq: Every day | ORAL | 0 refills | Status: DC

## 2017-02-06 MED ORDER — ALBUTEROL SULFATE HFA 108 (90 BASE) MCG/ACT IN AERS
2.0000 | INHALATION_SPRAY | Freq: Once | RESPIRATORY_TRACT | Status: AC
  Administered 2017-02-06: 2 via RESPIRATORY_TRACT
  Filled 2017-02-06: qty 6.7

## 2017-02-06 MED ORDER — SODIUM CHLORIDE 0.9 % IV BOLUS (SEPSIS)
1000.0000 mL | Freq: Once | INTRAVENOUS | Status: AC
Start: 2017-02-06 — End: 2017-02-06
  Administered 2017-02-06: 1000 mL via INTRAVENOUS

## 2017-02-06 MED ORDER — AEROCHAMBER PLUS FLO-VU LARGE MISC
Status: AC
  Administered 2017-02-06: 1
  Filled 2017-02-06: qty 1

## 2017-02-06 MED ORDER — BACLOFEN 10 MG PO TABS: 10.0000 mg | ORAL_TABLET | Freq: Three times a day (TID) | ORAL | 0 refills | Status: DC

## 2017-02-06 NOTE — ED Notes (Signed)
Pt oob to bathroom. Steady ngait

## 2017-02-06 NOTE — ED Triage Notes (Signed)
Pt states she fell a couple of days ago and hit arm and left rib cage area; pt states she been coughing up blood this morning; pt states she just stopped smoking and drinking 18 days ago after detox; Pt c/o 10/10 pain an states it's hard to breath. Pt  Speaking in full and complete sentences-Monique,RN

## 2017-02-06 NOTE — ED Notes (Signed)
Pt refuses ice. 

## 2017-02-06 NOTE — ED Notes (Signed)
RN educated pt on use of incentive spirometer, and inhaler with aerochamber. Pt verbalized understanding of both devices.

## 2017-02-06 NOTE — ED Notes (Signed)
Pt put back on O2 after sat noted at 88% om room air.

## 2017-02-06 NOTE — ED Notes (Signed)
Patient transported to CT 

## 2017-02-06 NOTE — Discharge Instructions (Signed)
Contact a health care provider if: °You have increased bruising or swelling. °You have pain that is not controlled with treatment. °You have a fever. °Get help right away if: °You have difficulty breathing or shortness of breath. °You develop a continual cough, or you cough up thick or bloody sputum. °You feel sick to your stomach (nauseous), you throw up (vomit), or you have abdominal pain. °

## 2017-02-06 NOTE — ED Provider Notes (Signed)
[facility] EMERGENCY DEPARTMENT Provider Note   CSN: [id number] Arrival date & time: 02/06/17  0453     History   Chief Complaint Chief Complaint  Patient presents with  . Fall  . Shortness of Breath  . Arm Pain  . Rib Injury    HPI Brittany Sanford is a 56 y.o. female presents the emergency department chief complaint of rib pain and hemoptysis.  The patient states that she has radiculopathy in her right leg and her leg frequently goes to sleep.  She is at her niece's house and stepped over a baby gate but did not realize that her leg was still behind her and had not cleared the gait.  She fell forward hitting her left arm and left rib cage against a concrete corner.  This was 3 days ago.  She denies hitting her head or losing consciousness.  Patient is excited to share that she has been through detox and is clean of alcohol and cocaine for the past 19 days and is going to meetings daily.  She has switched from smoking cigarettes to an e-cigarette and is trying to wean herself down.  She states that she is to cough heavily daily but has had decreased cough.  She complains of inspiratory pain pain with coughing and noticed yesterday that she coughed up a clot of blood and then had several episodes of thick sputum streaked with blood.  This brought her to the ER today.  She denies a history of blood clots in her legs or chest.  She denies fever, chills or other signs or symptoms of systemic infection.  HPI  Past Medical History:  Diagnosis Date  . ADHD (attention deficit hyperactivity disorder)   . Attention deficit disorder   . Bipolar 1 disorder (HCC)   . Bronchitis   . Substance abuse [facility])     Patient Active Problem List   Diagnosis Date Noted  . Pyelonephritis 09/06/2014  . SOB (shortness of breath) 09/06/2014  . History of bipolar disorder 04/15/2014  . Alcohol use disorder, severe, dependence (HCC) 04/14/2014  . Bipolar affective disorder (HCC) 04/14/2014    . Substance induced mood disorder (HCC) 04/14/2014  . Alcohol abuse   . Polysubstance abuse (HCC)     History reviewed. No pertinent surgical history.  OB History    Gravida Para Term Preterm AB Living   2 2           SAB TAB Ectopic Multiple Live Births                   Home Medications    Prior to Admission medications   Medication Sig Start Date End Date Taking? Authorizing Provider  acetaminophen (TYLENOL) 500 MG tablet Take 500 mg by mouth every 6 (six) hours as needed for mild pain, moderate pain, fever or headache.    [provider]  albuterol (PROVENTIL HFA;VENTOLIN HFA) 108 (90 Base) MCG/ACT inhaler Inhale 2 puffs into the lungs every 4 (four) hours as needed for wheezing or shortness of breath. 05/18/16   Irean Hong, MD  chlordiazePOXIDE (LIBRIUM) 25 MG capsule 50mg  PO TID x 1D, then 25-50mg  PO BID X 1D, then 25-50mg  PO QD X 1D Patient not taking: Reported on 06/28/2016 06/28/16   Elpidio Anis, PA-C  diphenhydrAMINE (BENADRYL) 25 MG tablet Take 25 mg by mouth every 6 (six) hours as needed for itching.     [provider]  HYDROcodone-acetaminophen (NORCO) 5-325 MG tablet Take  1 tablet by mouth every 6 (six) hours as needed for moderate pain. Patient not taking: Reported on 06/28/2016 05/18/16   Irean Hong, MD  ibuprofen (ADVIL,MOTRIN) 200 MG tablet Take 200 mg by mouth every 6 (six) hours as needed for moderate pain.     [provider]  lidocaine (LIDODERM) 5 % Place 1 patch onto the skin daily. Remove & Discard patch within 12 hours or as directed by MD 01/24/17   Antony Madura, PA-C  ondansetron (ZOFRAN ODT) 4 MG disintegrating tablet Take 1 tablet (4 mg total) by mouth every 8 (eight) hours as needed for nausea or vomiting. Patient not taking: Reported on 06/28/2016 05/18/16   Irean Hong, MD  predniSONE (DELTASONE) 20 MG tablet Take 2 tablets (40 mg total) by mouth daily. Take 40 mg by mouth daily for 3 days, then 20mg  by mouth daily for 3  days, then 10mg  daily for 3 days 01/24/17   Antony Madura, PA-C    Family History Family History  Problem Relation Age of Onset  . COPD Mother   . Cancer Father     Social History Social History  Substance Use Topics  . Smoking status: Former Smoker    Packs/day: 1.00    Types: Cigarettes    Quit date: 01/05/2015  . Smokeless tobacco: Never Used  . Alcohol use Yes     Comment: 12 pack daily     Allergies   Darvocet [propoxyphene n-acetaminophen]   Review of Systems Review of Systems  Ten systems reviewed and are negative for acute change, except as noted in the HPI.   Physical Exam Updated Vital Signs BP 131/87 (BP Location: Right Arm)   Pulse 99   Temp 99.3 F (37.4 C) (Oral)   Resp 16   SpO2 92%   Physical Exam  Constitutional: She is oriented to person, place, and time. She appears well-developed and well-nourished. No distress.  HENT:  Head: Normocephalic and atraumatic.  Eyes: Conjunctivae are normal. No scleral icterus.  Neck: Normal range of motion.  Cardiovascular: Normal rate, regular rhythm and normal heart sounds.  Exam reveals no gallop and no friction rub.   No murmur heard. Pulmonary/Chest: No respiratory distress. She has wheezes. She exhibits tenderness. She exhibits no crepitus and no deformity.    Inspiratory and expiratory wheezing noted throughout lung fields on examination  Abdominal: Soft. Bowel sounds are normal. She exhibits no distension and no mass. There is no tenderness. There is no guarding.  Musculoskeletal:       Arms: Neurological: She is alert and oriented to person, place, and time.  Skin: Skin is warm and dry. She is not diaphoretic.  Psychiatric: Her behavior is normal.  Nursing note and vitals reviewed.    ED Treatments / Results  Labs (all labs ordered are listed, but only abnormal results are displayed) Labs Reviewed  CBC  BASIC METABOLIC PANEL  I-STAT CHEM 8, ED    EKG  EKG  Interpretation  Date/Time:  Sunday February 07 2015 20:45:05 EDT Ventricular Rate:  107 PR Interval:  128 QRS Duration: 84 QT Interval:  352 QTC Calculation: 469 R Axis:   79 Text Interpretation:  Sinus tachycardia Possible Left atrial enlargement Borderline ECG Confirmed by MESNER MD, Barbara Cower [phone]) on 02/07/2015 10:16:32 PM Also confirmed by Marily Memos [phone]), editor Elita Quick ([id number])  on 02/06/2017 7:08:03 AM       Radiology Dg Chest 2 View  Result Date: 02/06/2017 CLINICAL DATA:  Larey Seat 2 days  ago, struck LEFT chest. Hemoptysis today. EXAM: CHEST  2 VIEW COMPARISON:  Chest radiograph February 07, 2015 FINDINGS: Cardiomediastinal silhouette is normal. Calcified aortic arch. No pleural effusions or focal consolidations. Similar chronic interstitial changes and increased lung volumes. Bandlike density RIGHT lung base. Trachea projects midline and there is no pneumothorax. Soft tissue planes and included osseous structures are non-suspicious. Stable scoliosis. IMPRESSION: COPD.  RIGHT lung base atelectasis/ scarring. Aortic Atherosclerosis (ICD10-I70.0). Electronically Signed   By: Awilda Metroourtnay  Bloomer M.D.   On: 02/06/2017 05:53   Dg Humerus Left  Result Date: 02/06/2017 CLINICAL DATA:  Larey SeatFell 2 days ago.  LEFT arm pain. EXAM: LEFT HUMERUS - 2+ VIEW COMPARISON:  None. FINDINGS: There is no evidence of fracture or other focal bone lesions. Soft tissues are unremarkable. IMPRESSION: Negative. Electronically Signed   By: Awilda Metroourtnay  Bloomer M.D.   On: 02/06/2017 05:54    Procedures Procedures (including critical care time)  Medications Ordered in ED Medications  sodium chloride 0.9 % bolus 1,000 mL (not administered)  ipratropium-albuterol (DUONEB) 0.5-2.5 (3) MG/3ML nebulizer solution 3 mL (not administered)  dexamethasone (DECADRON) injection 10 mg (not administered)  albuterol (PROVENTIL) (2.5 MG/3ML) 0.083% nebulizer solution 5 mg (5 mg Nebulization Given 02/06/17 1042)      Initial Impression / Assessment and Plan / ED Course  I have reviewed the triage vital signs and the nursing notes.  Pertinent labs & imaging results that were available during my care of the patient were reviewed by me and considered in my medical decision making (see chart for details).     Patient's CT scan is negative for PE or other intrathoracic abnormality. The patient will be discharged with mobic and baclofen. Given albuterol and incentive spirometer. The patient appears safe for discharge at this time.  Final Clinical Impressions(s) / ED Diagnoses   Final diagnoses:  Fall, initial encounter  Contusion of rib on right side, initial encounter    New Prescriptions New Prescriptions   No medications on file     Arthor CaptainHarris, Attila Mccarthy, PA-C 02/06/17 2150    Tegeler, Canary Brimhristopher J, MD 02/07/17 [phone]

## 2017-11-08 ENCOUNTER — Other Ambulatory Visit (HOSPITAL_COMMUNITY): Payer: Self-pay | Admitting: *Deleted

## 2017-11-08 DIAGNOSIS — N644 Mastodynia: Secondary | ICD-10-CM

## 2017-12-20 ENCOUNTER — Other Ambulatory Visit: Payer: Self-pay

## 2017-12-20 ENCOUNTER — Ambulatory Visit (HOSPITAL_COMMUNITY): Payer: Self-pay

## 2017-12-26 ENCOUNTER — Ambulatory Visit: Payer: Self-pay

## 2017-12-28 ENCOUNTER — Other Ambulatory Visit (HOSPITAL_COMMUNITY): Payer: Self-pay | Admitting: *Deleted

## 2017-12-28 ENCOUNTER — Ambulatory Visit: Payer: Self-pay

## 2017-12-28 ENCOUNTER — Other Ambulatory Visit: Payer: Self-pay

## 2017-12-28 DIAGNOSIS — Z0000 Encounter for general adult medical examination without abnormal findings: Secondary | ICD-10-CM

## 2018-03-01 ENCOUNTER — Other Ambulatory Visit (HOSPITAL_COMMUNITY): Payer: Self-pay | Admitting: *Deleted

## 2018-03-01 DIAGNOSIS — Z1231 Encounter for screening mammogram for malignant neoplasm of breast: Secondary | ICD-10-CM

## 2018-05-27 ENCOUNTER — Emergency Department: Payer: Medicaid Other

## 2018-05-27 ENCOUNTER — Other Ambulatory Visit: Payer: Self-pay

## 2018-05-27 ENCOUNTER — Encounter: Payer: Self-pay | Admitting: Emergency Medicine

## 2018-05-27 ENCOUNTER — Emergency Department
Admission: EM | Admit: 2018-05-27 | Discharge: 2018-05-27 | Disposition: A | Discharge status: Home / Self Care | Payer: Medicaid Other | Attending: Emergency Medicine | Admitting: Emergency Medicine

## 2018-05-27 DIAGNOSIS — S20211A Contusion of right front wall of thorax, initial encounter: Secondary | ICD-10-CM | POA: Diagnosis not present

## 2018-05-27 DIAGNOSIS — Z87891 Personal history of nicotine dependence: Secondary | ICD-10-CM | POA: Diagnosis not present

## 2018-05-27 DIAGNOSIS — S6992XA Unspecified injury of left wrist, hand and finger(s), initial encounter: Secondary | ICD-10-CM | POA: Diagnosis present

## 2018-05-27 DIAGNOSIS — W19XXXA Unspecified fall, initial encounter: Secondary | ICD-10-CM | POA: Diagnosis not present

## 2018-05-27 DIAGNOSIS — Y939 Activity, unspecified: Secondary | ICD-10-CM | POA: Insufficient documentation

## 2018-05-27 DIAGNOSIS — S62102A Fracture of unspecified carpal bone, left wrist, initial encounter for closed fracture: Secondary | ICD-10-CM

## 2018-05-27 DIAGNOSIS — Y999 Unspecified external cause status: Secondary | ICD-10-CM | POA: Insufficient documentation

## 2018-05-27 DIAGNOSIS — Y929 Unspecified place or not applicable: Secondary | ICD-10-CM | POA: Insufficient documentation

## 2018-05-27 DIAGNOSIS — Z79899 Other long term (current) drug therapy: Secondary | ICD-10-CM | POA: Diagnosis not present

## 2018-05-27 DIAGNOSIS — S6292XA Unspecified fracture of left wrist and hand, initial encounter for closed fracture: Secondary | ICD-10-CM | POA: Insufficient documentation

## 2018-05-27 MED ORDER — KETOROLAC TROMETHAMINE 60 MG/2ML IM SOLN
60.0000 mg | Freq: Once | INTRAMUSCULAR | Status: AC
  Administered 2018-05-27: 60 mg via INTRAMUSCULAR
  Filled 2018-05-27: qty 2

## 2018-05-27 MED ORDER — HYDROMORPHONE HCL 1 MG/ML IJ SOLN
1.0000 mg | Freq: Once | INTRAMUSCULAR | Status: AC
  Administered 2018-05-27: 1 mg via INTRAMUSCULAR
  Filled 2018-05-27: qty 1

## 2018-05-27 MED ORDER — OXYCODONE-ACETAMINOPHEN 7.5-325 MG PO TABS: 1.0000 | ORAL_TABLET | Freq: Four times a day (QID) | ORAL | 0 refills | Status: DC | PRN

## 2018-05-27 NOTE — ED Triage Notes (Signed)
Fall today, pain ribs and L wrist.

## 2018-05-27 NOTE — ED Provider Notes (Signed)
[facility] Emergency Department Provider Note   ____________________________________________   First MD Initiated Contact with Patient 05/27/18 1421     (approximate)  I have reviewed the triage vital signs and the nursing notes.   HISTORY  Chief Complaint Fall    HPI Brittany Sanford is a 58 y.o. female patient complained of chest wall and  left wrist pain secondary to a fall.   Patient denies LOC or head injury.  Patient presents in mild distress of obvious deformity to the left wrist.  Patient rates her pain as a 10/10.  Patient described the pain as "achy".  No palliative measures prior to arrival.   Past Medical History:  Diagnosis Date  . ADHD (attention deficit hyperactivity disorder)   . Attention deficit disorder   . Bipolar 1 disorder (HCC)   . Bronchitis   . Substance abuse [facility])     Patient Active Problem List   Diagnosis Date Noted  . Pyelonephritis 09/06/2014  . SOB (shortness of breath) 09/06/2014  . History of bipolar disorder 04/15/2014  . Alcohol use disorder, severe, dependence (HCC) 04/14/2014  . Bipolar affective disorder (HCC) 04/14/2014  . Substance induced mood disorder (HCC) 04/14/2014  . Alcohol abuse   . Polysubstance abuse (HCC)     History reviewed. No pertinent surgical history.  Prior to Admission medications   Medication Sig Start Date End Date Taking? Authorizing Provider  acetaminophen (TYLENOL) 500 MG tablet Take 500 mg by mouth every 6 (six) hours as needed for mild pain, moderate pain, fever or headache.    [provider]  albuterol (PROVENTIL HFA;VENTOLIN HFA) 108 (90 Base) MCG/ACT inhaler Inhale 2 puffs into the lungs every 4 (four) hours as needed for wheezing or shortness of breath. 05/18/16   Irean Hong, MD  baclofen (LIORESAL) 10 MG tablet Take 1 tablet (10 mg total) by mouth 3 (three) times daily. 02/06/17   Arthor Captain, PA-C  chlordiazePOXIDE (LIBRIUM) 25 MG capsule  PO TID x 1D,  then 25-50mg  PO BID X 1D, then 25-50mg  PO QD X 1D Patient not taking: Reported on 06/28/2016 06/28/16   Elpidio Anis, PA-C  diphenhydrAMINE (BENADRYL) 25 MG tablet Take 25 mg by mouth every 6 (six) hours as needed for itching.     [provider]  HYDROcodone-acetaminophen (NORCO) 5-325 MG tablet Take 1 tablet by mouth every 6 (six) hours as needed for moderate pain. Patient not taking: Reported on 06/28/2016 05/18/16   Irean Hong, MD  ibuprofen (ADVIL,MOTRIN) 200 MG tablet Take 200 mg by mouth every 6 (six) hours as needed for moderate pain.     [provider]  lidocaine (LIDODERM) 5 % Place 1 patch onto the skin daily. Remove & Discard patch within 12 hours or as directed by MD 01/24/17   Antony Madura, PA-C  meloxicam (MOBIC) 15 MG tablet Take 1 tablet (15 mg total) by mouth daily. 02/06/17   Harris, Abigail, PA-C  ondansetron (ZOFRAN ODT) 4 MG disintegrating tablet Take 1 tablet (4 mg total) by mouth every 8 (eight) hours as needed for nausea or vomiting. Patient not taking: Reported on 06/28/2016 05/18/16   Irean Hong, MD  oxyCODONE-acetaminophen (PERCOCET) 7.5-325 MG tablet Take 1 tablet by mouth every 6 (six) hours as needed. 05/27/18   Joni Reining, PA-C  predniSONE (DELTASONE) 20 MG tablet Take 2 tablets (40 mg total) by mouth daily. Take 40 mg by mouth daily for 3 days, then  by mouth daily for  3 days, then 10mg  daily for 3 days 01/24/17   Antony Madura, PA-C    Allergies Darvocet [propoxyphene n-acetaminophen]  Family History  Problem Relation Age of Onset  . COPD Mother   . Cancer Father     Social History Social History   Tobacco Use  . Smoking status: Former Smoker    Packs/day: 1.00    Types: Cigarettes    Last attempt to quit: 01/05/2015    Years since quitting: 3.3  . Smokeless tobacco: Never Used  Substance Use Topics  . Alcohol use: Yes    Comment: 12 pack daily  . Drug use: Yes    Types: Cocaine    Comment: daily use    Review of  Systems  Constitutional: No fever/chills Eyes: No visual changes. ENT: No sore throat. Cardiovascular: Denies chest pain. Respiratory: Denies shortness of breath. Gastrointestinal: No abdominal pain.  No nausea, no vomiting.  No diarrhea.  No constipation. Genitourinary: Negative for dysuria. Musculoskeletal: Negative for back pain. Skin: Negative for rash. Neurological: Negative for headaches, focal weakness or numbness. Psychiatric: Attention deficit disorder.  Alcohol and substance abuse.  Bipolar. Endocrine:  Hematological/Lymphatic:  Allergic/Immunilogical: **}  ____________________________________________   PHYSICAL EXAM:  VITAL SIGNS: ED Triage Vitals  Enc Vitals Group     BP 05/27/18 1345 124/70     Pulse Rate 05/27/18 1345 94     Resp 05/27/18 1345 20     Temp 05/27/18 1345 98 F (36.7 C)     Temp Source 05/27/18 1345 Oral     SpO2 05/27/18 1345 98 %     Weight 05/27/18 1346 100 lb (45.4 kg)     Height 05/27/18 1346 4\' 11"  (1.499 m)     Head Circumference --      Peak Flow --      Pain Score 05/27/18 1346 10     Pain Loc --      Pain Edu? --      Excl. in GC? --     Constitutional: Alert and oriented.  Moderate distress.    Neck: No stridor.  No cervical spine tenderness to palpation. Hematological/Lymphatic/Immunilogical: No cervical lymphadenopathy. Cardiovascular: Normal rate, regular rhythm. Grossly normal heart sounds.  Palpable radial pulse.  Respiratory: Normal respiratory effort.  No retractions. Lungs CTAB. Gastrointestinal: Soft and nontender. No distention. No abdominal bruits. No CVA tenderness. Musculoskeletal: Mild deformity left wrist.  Neurologic:  Normal speech and language. No gross focal neurologic deficits are appreciated. No gait instability. Skin:  Skin is warm, dry and intact. No rash noted. Psychiatric: Mood and affect are normal. Speech and behavior are normal.  ____________________________________________   LABS (all labs  ordered are listed, but only abnormal results are displayed)  Labs Reviewed - No data to display ____________________________________________  EKG   ____________________________________________  RADIOLOGY  ED MD interpretation:    Official radiology report(s): Dg Chest 2 View  Result Date: 05/27/2018 CLINICAL DATA:  Fall today, pain EXAM: CHEST - 2 VIEW COMPARISON:  02/06/2017 FINDINGS: Bilateral hyperinflation with emphysematous disease. No acute opacity or pleural effusion. Normal heart size. No pneumothorax. Marked focal scoliosis of the upper thoracic spine. IMPRESSION: No active cardiopulmonary disease. Hyperinflation with emphysematous disease. Electronically Signed   By: Jasmine Pang M.D.   On: 05/27/2018 14:40   Dg Wrist Complete Left  Result Date: 05/27/2018 CLINICAL DATA:  Fall with wrist pain EXAM: LEFT WRIST - COMPLETE 3+ VIEW COMPARISON:  None. FINDINGS: Acute mildly impacted distal radius fracture with probable intra-articular extension  on lateral view and about 1/4 bone with dorsal displacement of distal fracture fragment. Possible nondisplaced ulnar styloid process fracture IMPRESSION: Acute impacted and mildly displaced fracture involving the distal radius. Possible nondisplaced ulnar styloid process fracture Electronically Signed   By: Jasmine Pang M.D.   On: 05/27/2018 14:41    ____________________________________________   PROCEDURES  Procedure(s) performed: None  .Splint Application Date/Time: 05/27/2018 3:23 PM Performed by: Marguerita Merles, NT Authorized by: Joni Reining, PA-C   Consent:    Consent obtained:  Verbal   Consent given by:  Patient   Risks discussed:  Numbness, pain and swelling Pre-procedure details:    Sensation:  Normal Procedure details:    Laterality:  Left   Location:  Wrist   Wrist:  L wrist   Splint type:  Volar short arm   Supplies:  Ortho-Glass Post-procedure details:    Pain:  Unchanged   Sensation:  Normal    Patient tolerance of procedure:  Tolerated well, no immediate complications    Critical Care performed: No  ____________________________________________   INITIAL IMPRESSION / ASSESSMENT AND PLAN / ED COURSE  As part of my medical decision making, I reviewed the following data within the electronic MEDICAL RECORD NUMBER     Left wrist pain secondary to distal radius and ulnar styloid fracture.  Patient checks x-ray unremarkable except for emphysema.  Patient placed in a volar splint and sling.  Patient advised to follow orthopedic call for an appointment in the morning.  Take medication as needed for pain.     ____________________________________________   FINAL CLINICAL IMPRESSION(S) / ED DIAGNOSES  Final diagnoses:  Closed fracture of left wrist, initial encounter  Contusion of right chest wall, initial encounter     ED Discharge Orders         Ordered    oxyCODONE-acetaminophen (PERCOCET) 7.5-325 MG tablet  Every 6 hours PRN     05/27/18 1515           Note:  This document was prepared using Dragon voice recognition software and may include unintentional dictation errors.    Joni Reining, PA-C 05/27/18 1524    Arnaldo Natal, MD 05/27/18 [phone]

## 2018-05-27 NOTE — Discharge Instructions (Signed)
Wear splint and sling until evaluation by orthopedics.  Call in the morning to schedule appointment.

## 2018-05-27 NOTE — ED Notes (Signed)
See triage note  Presents s/p fall  Having pain to left wrist  Provider in room on arrival

## 2018-05-29 ENCOUNTER — Encounter (HOSPITAL_BASED_OUTPATIENT_CLINIC_OR_DEPARTMENT_OTHER): Payer: Self-pay | Admitting: *Deleted

## 2018-05-29 ENCOUNTER — Other Ambulatory Visit: Payer: Self-pay

## 2018-05-29 ENCOUNTER — Other Ambulatory Visit (HOSPITAL_COMMUNITY): Payer: Self-pay | Admitting: *Deleted

## 2018-05-29 DIAGNOSIS — N644 Mastodynia: Secondary | ICD-10-CM

## 2018-05-29 NOTE — Progress Notes (Signed)
Spoke with patient via telephone for pre op interview. NPO after MN. Patient to take gabapentin, Lexapro, Claritin, and Percocet PRN AM of surgery with a sip of water. Arrival time 21.

## 2018-05-29 NOTE — H&P (View-Only) (Signed)
Spoke with patient via telephone for pre op interview. NPO after MN. Patient to take gabapentin, Lexapro, Claritin, and Percocet PRN AM of surgery with a sip of water. Arrival time 0645. 

## 2018-06-03 ENCOUNTER — Encounter (HOSPITAL_BASED_OUTPATIENT_CLINIC_OR_DEPARTMENT_OTHER): Payer: Self-pay | Admitting: Anesthesiology

## 2018-06-03 NOTE — H&P (Signed)
[facility]  [address]   [address] [place] [address] [phone]   A Division of [facility]  RE: Brittany Sanford, Brittany Sanford   [id number]      DOB: May 15, 1960  05-29-18  REASON FOR VISIT: Left distal radius fracture.   HPI:   She is 58 years old. She is on Disability due to her back issues and psych issues. She fell on 05-27-18 and landed on her left outstretched wrist.  She has had pain ever since. She has been in a short arm splint.   EXAMINATION: Well appearing female in no apparent distress. She is neurovascularly intact to the left upper extremity. Skin is benign.  IMAGES: X-rays reviewed by me:   The X-rays show a significantly displaced  intra-articular fracture, distal radius.    ASSESSMENT/PLAN: I had a long talk with her about her options. Due to the significant displacement I think she would do better with open reduction and internal fixation of this fracture. We will have her set up for next week. We will try and speak with her primary care physician and psychiatric physician to obtain a risk assessment and make sure she is back on her normal psych medications.    Jewel Baize.  Eulah Pont, M.D.  Electronically verified by Jewel Baize. Eulah Pont, M.D. TDMNorman Herrlich D:  05-31-18 T:   06-03-18  cc:  Foye Deer, MD fax [phone]

## 2018-06-03 NOTE — Anesthesia Preprocedure Evaluation (Addendum)
Anesthesia Evaluation  Patient identified by MRN, date of birth, ID band Patient awake    Reviewed: Allergy & Precautions, NPO status , Patient's Chart, lab work & pertinent test results  Airway Mallampati: II  TM Distance: >3 FB Neck ROM: Full    Dental  (+) Missing, Loose, Poor Dentition,    Pulmonary former smoker,    Pulmonary exam normal breath sounds clear to auscultation       Cardiovascular negative cardio ROS Normal cardiovascular exam Rhythm:Regular Rate:Normal     Neuro/Psych PSYCHIATRIC DISORDERS Bipolar Disorder ADHDnegative neurological ROS     GI/Hepatic negative GI ROS, (+)     substance abuse  cocaine use,   Endo/Other    Renal/GU negative Renal ROS  negative genitourinary   Musculoskeletal Fx left distal radius   Abdominal   Peds  Hematology negative hematology ROS (+)   Anesthesia Other Findings   Reproductive/Obstetrics                            Anesthesia Physical Anesthesia Plan  ASA: II  Anesthesia Plan: General   Post-op Pain Management:  Regional for Post-op pain   Induction: Intravenous  PONV Risk Score and Plan: 2 and Midazolam, Propofol infusion, Ondansetron and Treatment may vary due to age or medical condition  Airway Management Planned: LMA  Additional Equipment:   Intra-op Plan:   Post-operative Plan: Extubation in OR  Informed Consent: I have reviewed the patients History and Physical, chart, labs and discussed the procedure including the risks, benefits and alternatives for the proposed anesthesia with the patient or authorized representative who has indicated his/her understanding and acceptance.     Dental advisory given  Plan Discussed with: CRNA and Surgeon  Anesthesia Plan Comments:        Anesthesia Quick Evaluation

## 2018-06-04 ENCOUNTER — Ambulatory Visit (HOSPITAL_BASED_OUTPATIENT_CLINIC_OR_DEPARTMENT_OTHER): Payer: Medicaid Other | Admitting: Anesthesiology

## 2018-06-04 ENCOUNTER — Encounter (HOSPITAL_BASED_OUTPATIENT_CLINIC_OR_DEPARTMENT_OTHER)
Admission: RE | Disposition: A | Discharge status: Home / Self Care | Payer: Self-pay | Source: Home / Self Care | Attending: Orthopedic Surgery

## 2018-06-04 ENCOUNTER — Encounter (HOSPITAL_BASED_OUTPATIENT_CLINIC_OR_DEPARTMENT_OTHER): Payer: Self-pay

## 2018-06-04 ENCOUNTER — Ambulatory Visit (HOSPITAL_BASED_OUTPATIENT_CLINIC_OR_DEPARTMENT_OTHER)
Admission: RE | Admit: 2018-06-04 | Discharge: 2018-06-04 | Disposition: A | Discharge status: Home / Self Care | Payer: Medicaid Other | Attending: Orthopedic Surgery | Admitting: Orthopedic Surgery

## 2018-06-04 ENCOUNTER — Other Ambulatory Visit: Payer: Self-pay

## 2018-06-04 DIAGNOSIS — S62102A Fracture of unspecified carpal bone, left wrist, initial encounter for closed fracture: Secondary | ICD-10-CM

## 2018-06-04 DIAGNOSIS — W1830XA Fall on same level, unspecified, initial encounter: Secondary | ICD-10-CM | POA: Insufficient documentation

## 2018-06-04 DIAGNOSIS — S52502A Unspecified fracture of the lower end of left radius, initial encounter for closed fracture: Secondary | ICD-10-CM | POA: Insufficient documentation

## 2018-06-04 DIAGNOSIS — Z87891 Personal history of nicotine dependence: Secondary | ICD-10-CM | POA: Diagnosis not present

## 2018-06-04 HISTORY — PX: ORIF WRIST FRACTURE: SHX2133

## 2018-06-04 SURGERY — OPEN REDUCTION INTERNAL FIXATION (ORIF) WRIST FRACTURE
Anesthesia: General | Site: Wrist | Laterality: Left

## 2018-06-04 MED ORDER — DEXAMETHASONE SODIUM PHOSPHATE 10 MG/ML IJ SOLN
INTRAMUSCULAR | Status: AC
  Filled 2018-06-04: qty 1

## 2018-06-04 MED ORDER — ACETAMINOPHEN 500 MG PO TABS: 1000.0000 mg | ORAL_TABLET | Freq: Three times a day (TID) | ORAL | 0 refills | Status: AC

## 2018-06-04 MED ORDER — PHENYLEPHRINE 40 MCG/ML (10ML) SYRINGE FOR IV PUSH (FOR BLOOD PRESSURE SUPPORT)
PREFILLED_SYRINGE | INTRAVENOUS | Status: AC
  Filled 2018-06-04: qty 10

## 2018-06-04 MED ORDER — PROPOFOL 10 MG/ML IV BOLUS
INTRAVENOUS | Status: DC | PRN
  Administered 2018-06-04: 130 mg via INTRAVENOUS

## 2018-06-04 MED ORDER — ONDANSETRON HCL 4 MG/2ML IJ SOLN
INTRAMUSCULAR | Status: AC
  Filled 2018-06-04: qty 2

## 2018-06-04 MED ORDER — PHENYLEPHRINE HCL 10 MG/ML IJ SOLN
INTRAMUSCULAR | Status: DC | PRN
  Administered 2018-06-04 (×5): 80 ug via INTRAVENOUS

## 2018-06-04 MED ORDER — LACTATED RINGERS IV SOLN
INTRAVENOUS | Status: DC
  Administered 2018-06-04: 100 mL/h via INTRAVENOUS
  Filled 2018-06-04: qty 1000

## 2018-06-04 MED ORDER — ACETAMINOPHEN 500 MG PO TABS
1000.0000 mg | ORAL_TABLET | ORAL | Status: AC
  Administered 2018-06-04: 1000 mg via ORAL
  Filled 2018-06-04: qty 2

## 2018-06-04 MED ORDER — ACETAMINOPHEN 500 MG PO TABS
ORAL_TABLET | ORAL | Status: AC
  Filled 2018-06-04: qty 2

## 2018-06-04 MED ORDER — LIDOCAINE 2% (20 MG/ML) 5 ML SYRINGE
INTRAMUSCULAR | Status: AC
  Filled 2018-06-04: qty 5

## 2018-06-04 MED ORDER — ONDANSETRON HCL 4 MG PO TABS: 4.0000 mg | ORAL_TABLET | Freq: Three times a day (TID) | ORAL | 0 refills | Status: AC | PRN

## 2018-06-04 MED ORDER — FENTANYL CITRATE (PF) 100 MCG/2ML IJ SOLN
INTRAMUSCULAR | Status: AC
  Filled 2018-06-04: qty 2

## 2018-06-04 MED ORDER — CHLORHEXIDINE GLUCONATE 4 % EX LIQD
60.0000 mL | Freq: Once | CUTANEOUS | Status: DC
  Filled 2018-06-04: qty 118

## 2018-06-04 MED ORDER — CEFAZOLIN SODIUM-DEXTROSE 2-4 GM/100ML-% IV SOLN
2.0000 g | INTRAVENOUS | Status: DC
  Filled 2018-06-04: qty 100

## 2018-06-04 MED ORDER — GABAPENTIN 300 MG PO CAPS
ORAL_CAPSULE | ORAL | Status: AC
  Filled 2018-06-04: qty 1

## 2018-06-04 MED ORDER — PROPOFOL 500 MG/50ML IV EMUL
INTRAVENOUS | Status: AC
  Filled 2018-06-04: qty 50

## 2018-06-04 MED ORDER — LACTATED RINGERS IV SOLN
INTRAVENOUS | Status: DC
  Administered 2018-06-04: 09:00:00 via INTRAVENOUS
  Filled 2018-06-04: qty 1000

## 2018-06-04 MED ORDER — FENTANYL CITRATE (PF) 100 MCG/2ML IJ SOLN
100.0000 ug | Freq: Once | INTRAMUSCULAR | Status: AC
  Administered 2018-06-04: 100 ug via INTRAVENOUS
  Filled 2018-06-04: qty 2

## 2018-06-04 MED ORDER — MIDAZOLAM HCL 2 MG/2ML IJ SOLN
2.0000 mg | Freq: Once | INTRAMUSCULAR | Status: AC
  Administered 2018-06-04: 1 mg via INTRAVENOUS
  Filled 2018-06-04: qty 2

## 2018-06-04 MED ORDER — DEXAMETHASONE SODIUM PHOSPHATE 4 MG/ML IJ SOLN
4.0000 mg | INTRAMUSCULAR | Status: DC
  Filled 2018-06-04: qty 1

## 2018-06-04 MED ORDER — MIDAZOLAM HCL 2 MG/2ML IJ SOLN
INTRAMUSCULAR | Status: AC
  Filled 2018-06-04: qty 2

## 2018-06-04 MED ORDER — OXYCODONE HCL 5 MG PO TABS: 5.0000 mg | ORAL_TABLET | ORAL | 0 refills | Status: AC | PRN

## 2018-06-04 MED ORDER — GABAPENTIN 300 MG PO CAPS
300.0000 mg | ORAL_CAPSULE | ORAL | Status: AC
  Administered 2018-06-04: 300 mg via ORAL
  Filled 2018-06-04: qty 1

## 2018-06-04 MED ORDER — CEFAZOLIN SODIUM-DEXTROSE 2-4 GM/100ML-% IV SOLN
INTRAVENOUS | Status: AC
  Filled 2018-06-04: qty 100

## 2018-06-04 MED ORDER — LIDOCAINE HCL (CARDIAC) PF 100 MG/5ML IV SOSY
PREFILLED_SYRINGE | INTRAVENOUS | Status: DC | PRN
  Administered 2018-06-04: 30 mg via INTRAVENOUS

## 2018-06-04 MED ORDER — DEXAMETHASONE SODIUM PHOSPHATE 10 MG/ML IJ SOLN
INTRAMUSCULAR | Status: DC | PRN
  Administered 2018-06-04: 4 mg via INTRAVENOUS

## 2018-06-04 MED ORDER — ONDANSETRON HCL 4 MG/2ML IJ SOLN
INTRAMUSCULAR | Status: DC | PRN
  Administered 2018-06-04: 4 mg via INTRAVENOUS

## 2018-06-04 MED ORDER — OMEPRAZOLE 20 MG PO CPDR: 20.0000 mg | DELAYED_RELEASE_CAPSULE | Freq: Every day | ORAL | 0 refills | Status: AC

## 2018-06-04 MED ORDER — GABAPENTIN 300 MG PO CAPS
300.0000 mg | ORAL_CAPSULE | Freq: Once | ORAL | Status: AC
  Filled 2018-06-04: qty 1

## 2018-06-04 SURGICAL SUPPLY — 85 items
BANDAGE ACE 4X5 VEL STRL LF (GAUZE/BANDAGES/DRESSINGS) ×3 IMPLANT
BANDAGE ELASTIC 4 VELCRO ST LF (GAUZE/BANDAGES/DRESSINGS) IMPLANT
BIT DRILL 2.2 SS TIBIAL (BIT) ×2 IMPLANT
BLADE CLIPPER SENSICLIP SURGIC (BLADE) IMPLANT
BLADE SURG 15 STRL LF DISP TIS (BLADE) ×1 IMPLANT
BLADE SURG 15 STRL SS (BLADE) ×3
BNDG CMPR 9X4 STRL LF SNTH (GAUZE/BANDAGES/DRESSINGS) ×1
BNDG COHESIVE 4X5 TAN STRL (GAUZE/BANDAGES/DRESSINGS) ×3 IMPLANT
BNDG ESMARK 4X9 LF (GAUZE/BANDAGES/DRESSINGS) ×3 IMPLANT
CHLORAPREP W/TINT 26ML (MISCELLANEOUS) ×3 IMPLANT
CLOSURE STERI-STRIP 1/2X4 (GAUZE/BANDAGES/DRESSINGS)
CLSR STERI-STRIP ANTIMIC 1/2X4 (GAUZE/BANDAGES/DRESSINGS) IMPLANT
CORD BIPOLAR FORCEPS 12FT (ELECTRODE) ×3 IMPLANT
COVER BACK TABLE 60X90IN (DRAPES) IMPLANT
COVER WAND RF STERILE (DRAPES) IMPLANT
CUFF TOURNIQUET SINGLE 18IN (TOURNIQUET CUFF) ×2 IMPLANT
CUFF TOURNIQUET SINGLE 24IN (TOURNIQUET CUFF) IMPLANT
DRAPE EXTREMITY T 121X128X90 (DISPOSABLE) IMPLANT
DRAPE IMP U-DRAPE 54X76 (DRAPES) ×3 IMPLANT
DRAPE OEC MINIVIEW 54X84 (DRAPES) ×3 IMPLANT
DRAPE U-SHAPE 47X51 STRL (DRAPES) ×3 IMPLANT
DRAPE U-SHAPE 76X120 STRL (DRAPES) IMPLANT
DRSG EMULSION OIL 3X3 NADH (GAUZE/BANDAGES/DRESSINGS) ×3 IMPLANT
ELECT REM PT RETURN 9FT ADLT (ELECTROSURGICAL) ×3
ELECTRODE REM PT RTRN 9FT ADLT (ELECTROSURGICAL) ×1 IMPLANT
GAUZE SPONGE 4X4 12PLY STRL (GAUZE/BANDAGES/DRESSINGS) ×3 IMPLANT
GAUZE XEROFORM 1X8 LF (GAUZE/BANDAGES/DRESSINGS) ×1 IMPLANT
GLOVE BIO SURGEON STRL SZ7.5 (GLOVE) ×6 IMPLANT
GLOVE BIOGEL PI IND STRL 8 (GLOVE) ×2 IMPLANT
GLOVE BIOGEL PI INDICATOR 8 (GLOVE) ×4
GOWN STRL REUS W/TWL XL LVL3 (GOWN DISPOSABLE) ×6 IMPLANT
K-WIRE 1.6 (WIRE) ×9
K-WIRE FX5X1.6XNS BN SS (WIRE) ×3
KWIRE FX5X1.6XNS BN SS (WIRE) IMPLANT
NDL HYPO 25X1 1.5 SAFETY (NEEDLE) IMPLANT
NEEDLE HYPO 25X1 1.5 SAFETY (NEEDLE) IMPLANT
NS IRRIG 1000ML POUR BTL (IV SOLUTION) ×3 IMPLANT
PACK ARTHROSCOPY DSU (CUSTOM PROCEDURE TRAY) ×3 IMPLANT
PACK BASIN DAY SURGERY FS (CUSTOM PROCEDURE TRAY) ×3 IMPLANT
PAD CAST 4YDX4 CTTN HI CHSV (CAST SUPPLIES) ×1 IMPLANT
PADDING CAST ABS 4INX4YD NS (CAST SUPPLIES)
PADDING CAST ABS COTTON 4X4 ST (CAST SUPPLIES) ×1 IMPLANT
PADDING CAST COTTON 4X4 STRL (CAST SUPPLIES) ×6
PASSER SUT SWANSON 36MM LOOP (INSTRUMENTS) IMPLANT
PEG LOCKING SMOOTH 2.2X16 (Screw) ×2 IMPLANT
PEG LOCKING SMOOTH 2.2X18 (Peg) ×10 IMPLANT
PEG LOCKING SMOOTH 2.2X20 (Screw) ×2 IMPLANT
PENCIL BUTTON HOLSTER BLD 10FT (ELECTRODE) ×1 IMPLANT
PLATE STANDARD DVR LEFT (Plate) ×3 IMPLANT
PLATE STD DVR LT 24X51 (Plate) IMPLANT
SCREW LOCK 12X2.7X 3 LD (Screw) IMPLANT
SCREW LOCKING 2.7X12MM (Screw) ×3 IMPLANT
SCREW LOCKING 2.7X13MM (Screw) ×4 IMPLANT
SLING ARM FOAM STRAP LRG (SOFTGOODS) ×1 IMPLANT
SLING ARM FOAM STRAP MED (SOFTGOODS) IMPLANT
SLING ARM IMMOBILIZER LRG (SOFTGOODS) IMPLANT
SLING ARM IMMOBILIZER MED (SOFTGOODS) IMPLANT
SLING ARM MED ADULT FOAM STRAP (SOFTGOODS) ×2 IMPLANT
SLING ARM XL FOAM STRAP (SOFTGOODS) ×6 IMPLANT
SPLINT FAST PLASTER 5X30 (CAST SUPPLIES)
SPLINT PLASTER CAST FAST 5X30 (CAST SUPPLIES) ×10 IMPLANT
SPONGE LAP 18X18 RF (DISPOSABLE) ×6 IMPLANT
STAPLER VISISTAT 35W (STAPLE) IMPLANT
SUCTION FRAZIER HANDLE 10FR (MISCELLANEOUS) ×2
SUCTION TUBE FRAZIER 10FR DISP (MISCELLANEOUS) ×1 IMPLANT
SUT ETHILON 3 0 PS 1 (SUTURE) IMPLANT
SUT ETHILON 4 0 PS 2 18 (SUTURE) ×2 IMPLANT
SUT FIBERWIRE #2 38 T-5 BLUE (SUTURE)
SUT MNCRL AB 4-0 PS2 18 (SUTURE) IMPLANT
SUT PROLENE 3 0 PS 2 (SUTURE) IMPLANT
SUT VIC AB 0 CT1 27 (SUTURE)
SUT VIC AB 0 CT1 27XBRD ANBCTR (SUTURE) ×1 IMPLANT
SUT VIC AB 0 SH 27 (SUTURE) ×1 IMPLANT
SUT VIC AB 2-0 SH 27 (SUTURE) ×3
SUT VIC AB 2-0 SH 27XBRD (SUTURE) ×1 IMPLANT
SUT VIC AB 3-0 SH 27 (SUTURE)
SUT VIC AB 3-0 SH 27X BRD (SUTURE) IMPLANT
SUTURE FIBERWR #2 38 T-5 BLUE (SUTURE) IMPLANT
SYR BULB 3OZ (MISCELLANEOUS) ×3 IMPLANT
SYR CONTROL 10ML LL (SYRINGE) IMPLANT
TOWEL OR 17X26 10 PK STRL BLUE (TOWEL DISPOSABLE) ×3 IMPLANT
TUBE CONNECTING 12'X1/4 (SUCTIONS) ×1
TUBE CONNECTING 12X1/4 (SUCTIONS) ×2 IMPLANT
UNDERPAD 30X30 (UNDERPADS AND DIAPERS) ×3 IMPLANT
YANKAUER SUCT BULB TIP NO VENT (SUCTIONS) ×3 IMPLANT

## 2018-06-04 NOTE — Anesthesia Procedure Notes (Signed)
Anesthesia Regional Block: Axillary brachial plexus block   Pre-Anesthetic Checklist: ,, timeout performed, Correct Patient, Correct Site, Correct Laterality, Correct Procedure, Correct Position, site marked, Risks and benefits discussed,  Surgical consent,  Pre-op evaluation,  At surgeon's request and post-op pain management  Laterality: Left  Prep: chloraprep       Needles:  Injection technique: Single-shot  Needle Type: Echogenic Stimulator Needle     Needle Length: 9cm  Needle Gauge: 21   Needle insertion depth: 4 cm   Additional Needles:   Procedures:,,,, ultrasound used (permanent image in chart),,,,  Motor weakness within 10 minutes.  Narrative:  Start time: 06/04/2018 8:42 AM End time: 06/04/2018 8:47 AM Injection made incrementally with aspirations every 5 mL.  Performed by: Personally  Anesthesiologist: Mal Amabile, MD  Additional Notes: Timeout performed. Patient sedated. Relevant anatomy ID'd using [place]. Incremental 2-61ml injection of LA with frequent aspiration. Patient tolerated procedure well.        Left Axillary Block

## 2018-06-04 NOTE — Anesthesia Postprocedure Evaluation (Signed)
Anesthesia Post Note  Patient: Brittany Sanford  Procedure(s) Performed: OPEN REDUCTION INTERNAL FIXATION (ORIF) WRIST FRACTURE (Left Wrist)     Patient location during evaluation: PACU Anesthesia Type: General Level of consciousness: awake and alert and oriented Pain management: pain level controlled Vital Signs Assessment: post-procedure vital signs reviewed and stable Respiratory status: spontaneous breathing, nonlabored ventilation and respiratory function stable Cardiovascular status: blood pressure returned to baseline and stable Postop Assessment: no apparent nausea or vomiting Anesthetic complications: no    Last Vitals:  Vitals:   06/04/18 1045 06/04/18 1100  BP: 139/78 132/80  Pulse: 73 74  Resp: 11 11  Temp:    SpO2: 95% 93%    Last Pain:  Vitals:   06/04/18 1100  TempSrc:   PainSc: Asleep                 Brance Dartt A.

## 2018-06-04 NOTE — Anesthesia Procedure Notes (Signed)
Procedure Name: LMA Insertion Date/Time: 06/04/2018 9:19 AM Performed by: Shanon Payor, CRNA Pre-anesthesia Checklist: Patient identified, Emergency Drugs available, Suction available, Patient being monitored and Timeout performed Patient Re-evaluated:Patient Re-evaluated prior to induction Oxygen Delivery Method: Circle system utilized Preoxygenation: Pre-oxygenation with 100% oxygen Induction Type: IV induction LMA: LMA inserted LMA Size: 3.0 Number of attempts: 1 Placement Confirmation: positive ETCO2 and breath sounds checked- equal and bilateral Tube secured with: Tape Dental Injury: Teeth and Oropharynx as per pre-operative assessment

## 2018-06-04 NOTE — Transfer of Care (Signed)
Immediate Anesthesia Transfer of Care Note  Patient: Brittany Sanford  Procedure(s) Performed: OPEN REDUCTION INTERNAL FIXATION (ORIF) WRIST FRACTURE (Left Wrist)  Patient Location: PACU  Anesthesia Type:General  Level of Consciousness: awake, alert  and oriented  Airway & Oxygen Therapy: Patient Spontanous Breathing and Patient connected to face mask oxygen  Post-op Assessment: Report given to RN and Post -op Vital signs reviewed and stable  Post vital signs: Reviewed and stable  Last Vitals:  Vitals Value Taken Time  BP 108/73 06/04/2018 10:15 AM  Temp    Pulse 71 06/04/2018 10:20 AM  Resp 12 06/04/2018 10:20 AM  SpO2 99 % 06/04/2018 10:20 AM  Vitals shown include unvalidated device data.  Last Pain:  Vitals:   06/04/18 0825  TempSrc:   PainSc: 8       Patients Stated Pain Goal: 3 (06/04/18 0825)  Complications: No apparent anesthesia complications

## 2018-06-04 NOTE — Interval H&P Note (Signed)
I participated in the care of this patient and agree with the above history, physical and evaluation. I performed a review of the history and a physical exam as detailed   Brittany Sanford Daniel Cheryal Salas MD  

## 2018-06-04 NOTE — Op Note (Signed)
06/04/2018  10:01 AM  PATIENT:  Brittany Sanford    PRE-OPERATIVE DIAGNOSIS:  FRACTURE OF LOWER END OF LEFT RADIUS  POST-OPERATIVE DIAGNOSIS:  Same  PROCEDURE:  OPEN REDUCTION INTERNAL FIXATION (ORIF) WRIST FRACTURE  SURGEON:  Sheral Apley, MD  ASSISTANT: Aquilla Hacker, PA-C, he was present and scrubbed throughout the case, critical for completion in a timely fashion, and for retraction, instrumentation, and closure.   ANESTHESIA:   gen  PREOPERATIVE INDICATIONS:  Brittany Sanford is a  58 y.o. female with a diagnosis of FRACTURE OF LOWER END OF LEFT RADIUS who failed conservative measures and elected for surgical management.    The risks benefits and alternatives were discussed with the patient preoperatively including but not limited to the risks of infection, bleeding, nerve injury, cardiopulmonary complications, the need for revision surgery, among others, and the patient was willing to proceed.  OPERATIVE IMPLANTS: DVR plate  OPERATIVE FINDINGS:  Unstable fracture  BLOOD LOSS: min  COMPLICATIONS: none  TOURNIQUET TIME:  OPERATIVE PROCEDURE:  Patient was identified in the preoperative holding area and site was marked by me She was transported to the operating theater and placed on the table in supine position taking care to pad all bony prominences. After a preincinduction time out anesthesia was induced. The left upper extremity was prepped and draped in normal sterile fashion and a pre-incision timeout was performed. She received ancef for preoperative antibiotics.   I made a 5 cm incision centered over her FCR tendon and dissected down carefully to the level of the flexor tendon sheath and incise this longitudinally and retracted the FCR radially and incised the dorsal aspect of the sheath.   I bluntly dissected the FPL muscle belly away from the brachioradialis and then sharply incised the pronator tendon from the distal radius and from the wrist capsule. I Elevated  this off the bone the fractures visible.   I released the brachioradialis from its insertion. I then debrided the fracture and performed a manual reduction.   I selected a plate and I placed it on the bone. I pinned it into place and was happy on multiple radiographic views with it's placement. I then fixed the plate distally with the locking pegs. I confirmed no articular penetration with the pegs and that none were prominent dorsally.   I then reduced the plate to the shaft improving the volar and radial tilt of her distal radius.  I was happy with the final fluoro xrays    I thoroughly irrigated the wound and closed the pronator over top of the plate and then closed the skin in layers with absorbable stitch. Sterile dressing was applied using the PACU in stable condition.  POST OPERATIVE PLAN: NWB, Splint full time. Ambulate for DVT px.

## 2018-06-04 NOTE — Progress Notes (Signed)
6244- to PACU to be monitored for Axillary block, left arm 0835- Time out performed, O2  Nasal canula 2 liters 0835 Fentanyl 100 mcq given IV 0836- Versed 1 mgm given IV  Patient tolerated well ,O2 per mask at 6 liters.  Procedure performed by Dr. Deatra Ina- procedure completed. Tolerated well, patient arousable .

## 2018-06-04 NOTE — Discharge Instructions (Signed)
Keep wrist elevated with ice as much as possible to reduce pain and swelling.  Stop as needed pain medication as soon as you are able.  Diet: As you were doing prior to hospitalization   Shower:  You have a splint on, leave the splint in place and keep the splint dry with a plastic bag.  Dressing:  You have a splint - leave the splint in place and we will change your bandages during your first follow-up appointment.   Activity:  Increase activity slowly as tolerated, but follow the weight bearing instructions below.  The rules on driving is that you can not be taking narcotics while you drive, and you must feel in control of the vehicle.    Weight Bearing:  Non weight bearing affected wrist.  Sling for comfort.   To prevent constipation: you may use a stool softener such as -  Colace (over the counter) 100 mg by mouth twice a day  Drink plenty of fluids (prune juice may be helpful) and high fiber foods Miralax (over the counter) for constipation as needed.    Itching:  If you experience itching with your medications, try taking only a single pain pill, or even half a pain pill at a time.  You can also use benadryl over the counter for itching or also to help with sleep.   Precautions:  If you experience chest pain or shortness of breath - call 911 immediately for transfer to the hospital emergency department!!  If you develop a fever greater that 101 F, purulent drainage from wound, increased redness or drainage from wound, or calf pain -- Call the office at [phone]                                         Follow- Up Appointment:  Please call for an appointment to be seen in 1-2 weeks [place] - [phone]\      HOME CARE INSTRUCTIONS The following instructions have been prepared to help you care for yourself upon your return home today.  Wound Care:  Keep your hand elevated above the level of your heart. Do not allow it to dangle by your side. Keep the dressing dry and do  not remove it unless your doctor advises you to do so. He will usually change it at the time of you post-op visit. Moving your fingers is advised to stimulate circulation but will depend on the site of your surgery. Of course, if you have a splint applied your doctor will advise you about movement.  Activity:  Do not drive or operate machinery today. Rest today and then you may return to your normal activity and work as indicated by your physician.  Diet: Drink liquids today or eat a light diet. You may resume a regular diet tomorrow.  General expectations: Pain for two or three days. Fingers may become slightly swollen.   Unexpected Observations- Call your doctor if any of these occur: Severe pain not relieved by pain medication. Elevated temperature. Dressing soaked with blood. Inability to move fingers. White or bluish color to fingers.   Regional Anesthesia Blocks  1. Numbness or the inability to move the "blocked" extremity may last from 3-48 hours after placement. The length of time depends on the medication injected and your individual response to the medication. If the numbness is not going away after 48 hours, call your surgeon.  2. The extremity that is blocked will need to be protected until the numbness is gone and the  Strength has returned. Because you cannot feel it, you will need to take extra care to avoid injury. Because it may be weak, you may have difficulty moving it or using it. You may not know what position it is in without looking at it while the block is in effect.  3. For blocks in the legs and feet, returning to weight bearing and walking needs to be done carefully. You will need to wait until the numbness is entirely gone and the strength has returned. You should be able to move your leg and foot normally before you try and bear weight or walk. You will need someone to be with you when you first try to ensure you do not fall and possibly risk injury.  4.  Bruising and tenderness at the needle site are common side effects and will resolve in a few days.  5. Persistent numbness or new problems with movement should be communicated to the surgeon or the [facility] [phone] [facility] [phone]   Post Anesthesia Home Care Instructions  Activity: Get plenty of rest for the remainder of the day. A responsible adult should stay with you for 24 hours following the procedure.  For the next 24 hours, DO NOT: -Drive a car -Advertising copywriter -Drink alcoholic beverages -Take any medication unless instructed by your physician -Make any legal decisions or sign important papers.  Meals: Start with liquid foods such as gelatin or soup. Progress to regular foods as tolerated. Avoid greasy, spicy, heavy foods. If nausea and/or vomiting occur, drink only clear liquids until the nausea and/or vomiting subsides. Call your physician if vomiting continues.  Special Instructions/Symptoms: Your throat may feel dry or sore from the anesthesia or the breathing tube placed in your throat during surgery. If this causes discomfort, gargle with warm salt water. The discomfort should disappear within 24 hours.  If you had a scopolamine patch placed behind your ear for the management of post- operative nausea and/or vomiting:  1. The medication in the patch is effective for 72 hours, after which it should be removed.  Wrap patch in a tissue and discard in the trash. Wash hands thoroughly with soap and water. 2. You may remove the patch earlier than 72 hours if you experience unpleasant side effects which may include dry mouth, dizziness or visual disturbances. 3. Avoid touching the patch. Wash your hands with soap and water after contact with the patch.

## 2018-06-05 NOTE — Addendum Note (Signed)
Addendum  created 06/05/18 0844 by Francie Massing, CRNA   Charge Capture section accepted

## 2018-06-06 ENCOUNTER — Other Ambulatory Visit: Payer: Self-pay

## 2018-06-06 ENCOUNTER — Ambulatory Visit (HOSPITAL_COMMUNITY): Payer: Self-pay

## 2018-06-07 ENCOUNTER — Encounter (HOSPITAL_BASED_OUTPATIENT_CLINIC_OR_DEPARTMENT_OTHER): Payer: Self-pay | Admitting: Orthopedic Surgery

## 2018-07-31 ENCOUNTER — Telehealth (HOSPITAL_COMMUNITY): Payer: Self-pay | Admitting: *Deleted

## 2018-07-31 NOTE — Telephone Encounter (Signed)
Telephoned patient at home number and confirmed [facility] appointment for April 23. No symptoms of COVID-19. No contact with someone with a confirmed diagnosis of COVID-19. No travel outside of [place] in the past 14 days.

## 2018-08-01 ENCOUNTER — Other Ambulatory Visit: Payer: Self-pay

## 2018-08-01 ENCOUNTER — Ambulatory Visit (HOSPITAL_COMMUNITY): Payer: Self-pay

## 2019-02-19 ENCOUNTER — Ambulatory Visit: Payer: Self-pay | Admitting: Sports Medicine

## 2019-02-19 ENCOUNTER — Other Ambulatory Visit: Payer: Self-pay | Admitting: Sports Medicine

## 2019-02-19 DIAGNOSIS — M21619 Bunion of unspecified foot: Secondary | ICD-10-CM

## 2019-02-23 ENCOUNTER — Emergency Department (HOSPITAL_COMMUNITY)
Admission: EM | Admit: 2019-02-23 | Discharge: 2019-02-23 | Disposition: A | Discharge status: Home / Self Care | Payer: Medicaid Other | Attending: Emergency Medicine | Admitting: Emergency Medicine

## 2019-02-23 ENCOUNTER — Encounter (HOSPITAL_COMMUNITY): Payer: Self-pay | Admitting: Emergency Medicine

## 2019-02-23 ENCOUNTER — Emergency Department (HOSPITAL_COMMUNITY): Payer: Medicaid Other

## 2019-02-23 ENCOUNTER — Other Ambulatory Visit: Payer: Self-pay

## 2019-02-23 ENCOUNTER — Encounter (HOSPITAL_COMMUNITY): Payer: Self-pay

## 2019-02-23 ENCOUNTER — Ambulatory Visit (HOSPITAL_COMMUNITY)
Admission: EM | Admit: 2019-02-23 | Discharge: 2019-02-23 | Disposition: A | Discharge status: Home / Self Care | Payer: Medicaid Other | Attending: Emergency Medicine | Admitting: Emergency Medicine

## 2019-02-23 DIAGNOSIS — S199XXA Unspecified injury of neck, initial encounter: Secondary | ICD-10-CM

## 2019-02-23 DIAGNOSIS — M542 Cervicalgia: Secondary | ICD-10-CM | POA: Diagnosis not present

## 2019-02-23 DIAGNOSIS — M545 Low back pain: Secondary | ICD-10-CM | POA: Insufficient documentation

## 2019-02-23 DIAGNOSIS — W19XXXA Unspecified fall, initial encounter: Secondary | ICD-10-CM

## 2019-02-23 DIAGNOSIS — Z23 Encounter for immunization: Secondary | ICD-10-CM | POA: Diagnosis not present

## 2019-02-23 DIAGNOSIS — Y929 Unspecified place or not applicable: Secondary | ICD-10-CM | POA: Insufficient documentation

## 2019-02-23 DIAGNOSIS — Y999 Unspecified external cause status: Secondary | ICD-10-CM | POA: Diagnosis not present

## 2019-02-23 DIAGNOSIS — S0990XA Unspecified injury of head, initial encounter: Secondary | ICD-10-CM | POA: Diagnosis not present

## 2019-02-23 DIAGNOSIS — Z87891 Personal history of nicotine dependence: Secondary | ICD-10-CM | POA: Diagnosis not present

## 2019-02-23 DIAGNOSIS — T07XXXA Unspecified multiple injuries, initial encounter: Secondary | ICD-10-CM | POA: Diagnosis not present

## 2019-02-23 DIAGNOSIS — W109XXA Fall (on) (from) unspecified stairs and steps, initial encounter: Secondary | ICD-10-CM | POA: Diagnosis not present

## 2019-02-23 DIAGNOSIS — Y939 Activity, unspecified: Secondary | ICD-10-CM | POA: Diagnosis not present

## 2019-02-23 MED ORDER — LIDOCAINE 5 % EX PTCH: 1.0000 | MEDICATED_PATCH | CUTANEOUS | 0 refills | Status: AC

## 2019-02-23 MED ORDER — HYDROMORPHONE HCL 1 MG/ML IJ SOLN
0.5000 mg | Freq: Once | INTRAMUSCULAR | Status: AC
  Administered 2019-02-23: 0.5 mg via INTRAMUSCULAR
  Filled 2019-02-23: qty 1

## 2019-02-23 MED ORDER — CYCLOBENZAPRINE HCL 10 MG PO TABS: 10.0000 mg | ORAL_TABLET | Freq: Two times a day (BID) | ORAL | 0 refills | Status: AC | PRN

## 2019-02-23 MED ORDER — TETANUS-DIPHTH-ACELL PERTUSSIS 5-2.5-18.5 LF-MCG/0.5 IM SUSP
0.5000 mL | Freq: Once | INTRAMUSCULAR | Status: AC
  Administered 2019-02-23: 0.5 mL via INTRAMUSCULAR
  Filled 2019-02-23: qty 0.5

## 2019-02-23 NOTE — Discharge Instructions (Addendum)
I am very concerned about your neck and worried that she may need more advanced imaging than an x-ray.  He also may need medication to help with your pain and muscle spasms.  Keep the collar on until we know that you are neck is okay.

## 2019-02-23 NOTE — ED Triage Notes (Signed)
Pt present she fall today and hurt her neck, hip and leg. Pt has a laceration on the right side of her hip. Pt cannot hold her neck up at all

## 2019-02-23 NOTE — ED Triage Notes (Addendum)
Pt states she fell down a flight of stairs approx 2 hours ago.  Went to [facility] and sent to ED for further eval.  States she was carrying a 10 gal fish tank and either tripped or ankle gave way.  Superficial lacerations to bilateral legs from fish tank glass.  Denies LOC.  Reports hitting her head on a car at bottom of steps.  C/o severe neck pain.  Aspen collar in place PTA.

## 2019-02-23 NOTE — Discharge Instructions (Addendum)
You were evaluated in the Emergency Department and after careful evaluation, we did not find any emergent condition requiring admission or further testing in the hospital.  Your exam/testing today is overall reassuring.  Please take the medications provided as directed for pain and follow-up with your regular doctor.  Please return to the Emergency Department if you experience any worsening of your condition.  We encourage you to follow up with a primary care provider.  Thank you for allowing [place] to be a part of your care.

## 2019-02-23 NOTE — ED Provider Notes (Signed)
[facility]-EMER[facility]ENCY DEPT [facility] Emergency Department Provider Note MRN:  [id number]  Arrival date & time: 02/23/19     Chief Complaint   Fall and Neck Pain   History of Present Illness   Brittany Sanford is a 58 y.o. year-old female with a history of substance abuse, bipolar disorder presenting to the ED with chief complaint of neck pain.  Fall down flight of stairs while holding a 10 gallon fish tank.  Extension injury to her neck, complaining severe neck pain.  Worse when trying to straighten her neck.  Also endorsing head trauma, unsure of loss of consciousness.  Endorsing mild low back pain.  Denying chest pain, no shortness of breath, no abdominal pain, no significant injuries to arms or legs, some abrasions.  Review of Systems  A complete 10 system review of systems was obtained and all systems are negative except as noted in the HPI and PMH.   Patient's Health History    Past Medical History:  Diagnosis Date  . ADHD (attention deficit hyperactivity disorder)   . Attention deficit disorder   . Bipolar 1 disorder (HCC)   . Bronchitis   . Substance abuse [facility])     Past Surgical History:  Procedure Laterality Date  . ORIF WRIST FRACTURE Left 06/04/2018   Procedure: OPEN REDUCTION INTERNAL FIXATION (ORIF) WRIST FRACTURE;  Surgeon: Sheral Apley, MD;  Location: [facility];  Service: Orthopedics;  Laterality: Left;    Family History  Problem Relation Age of Onset  . COPD Mother   . Cancer Father     Social History   Socioeconomic History  . Marital status: Divorced    Spouse name: Not on file  . Number of children: Not on file  . Years of education: Not on file  . Highest education level: Not on file  Occupational History  . Not on file  Social Needs  . Financial resource strain: Not on file  . Food insecurity    Worry: Not on file    Inability: Not on file  . Transportation needs    Medical: Not on file    Non-medical: Not on file  Tobacco  Use  . Smoking status: Former Smoker    Packs/day: 1.00    Types: Cigarettes    Quit date: 01/05/2015    Years since quitting: 4.1  . Smokeless tobacco: Never Used  Substance and Sexual Activity  . Alcohol use: Yes    Comment: 12 pack daily  . Drug use: Yes    Types: Cocaine, Benzodiazepines    Comment: daily use  . Sexual activity: Yes    Birth control/protection: None  Lifestyle  . Physical activity    Days per week: Not on file    Minutes per session: Not on file  . Stress: Not on file  Relationships  . Social Musician on phone: Not on file    Gets together: Not on file    Attends religious service: Not on file    Active member of club or organization: Not on file    Attends meetings of clubs or organizations: Not on file    Relationship status: Not on file  . Intimate partner violence    Fear of current or ex partner: Not on file    Emotionally abused: Not on file    Physically abused: Not on file    Forced sexual activity: Not on file  Other Topics Concern  . Not on file  Social History Narrative  . Not on file     Physical Exam  Vital Signs and Nursing Notes reviewed Vitals:   02/23/19 1730 02/23/19 1745  BP: 125/77 133/82  Pulse: 78 81  Resp: 14 15  Temp:    SpO2: 94% 92%    CONSTITUTIONAL: Well-appearing, in moderate distress due to pain NEURO:  Alert and oriented x 3, no focal deficits EYES:  eyes equal and reactive ENT/NECK:  no LAD, no JVD CARDIO: Regular rate, well-perfused, normal S1 and S2 PULM:  CTAB no wheezing or rhonchi GI/GU:  normal bowel sounds, non-distended, non-tender MSK/SPINE:  No gross deformities, no edema, midline cervical spinal tenderness, paraspinal lumbar spinal tenderness SKIN:  no rash, atraumatic PSYCH:  Appropriate speech and behavior  Diagnostic and Interventional Summary    EKG Interpretation  Date/Time:    Ventricular Rate:    PR Interval:    QRS Duration:   QT Interval:    QTC Calculation:   R  Axis:     Text Interpretation:        Labs Reviewed - No data to display  XR Chest 2 View  Final Result    DG Thoracic Spine 2 View  Final Result    DG Lumbar Spine Complete  Final Result    CT Cervical Spine  Final Result    CT Head  Final Result      Medications  HYDROmorphone (DILAUDID) injection 0.5 mg (0.5 mg Intramuscular Given 02/23/19 1712)  Tdap (BOOSTRIX) injection 0.5 mL (0.5 mLs Intramuscular Given 02/23/19 1714)  HYDROmorphone (DILAUDID) injection 0.5 mg (0.5 mg Intramuscular Given 02/23/19 1859)     Procedures  /  Critical Care Procedures  ED Course and Medical Decision Making  I have reviewed the triage vital signs and the nursing notes.  Pertinent labs & imaging results that were available during my care of the patient were reviewed by me and considered in my medical decision making (see below for details).     Concern for traumatic injury to the neck or head, will need CT imaging.  Lower concern for significant injury to the low back, will screen with x-rays.  8:30 PM update: Imaging is reassuring, able to clear C-spine.  Patient continues to have soreness but feels well enough for discharge.  She continues to have a normal neurological exam, she has no bowel or bladder dysfunction, her C-spine imaging does show some signs of stenosis but today there is no evidence of myelopathy, no indication for MRI, she is appropriate for discharge with pain management.  Barth Kirks. Sedonia Small, MD [place] [email or web address]  Final Clinical Impressions(s) / ED Diagnoses     ICD-10-CM   1. Neck pain  M54.2   2. Fall  W19.XXXA XR Chest 2 View    XR Chest 2 View    ED Discharge Orders         Ordered    lidocaine (LIDODERM) 5 %  Every 24 hours     02/23/19 2035    cyclobenzaprine (FLEXERIL) 10 MG tablet  2 times daily PRN     02/23/19 2035           Discharge Instructions Discussed with and Provided to  Patient:     Discharge Instructions     You were evaluated in the Emergency Department and after careful evaluation, we did not find any emergent condition requiring admission or further testing in the hospital.  Your exam/testing today is overall reassuring.  Please take the medications provided as directed for pain and follow-up with your regular doctor.  Please return to the Emergency Department if you experience any worsening of your condition.  We encourage you to follow up with a primary care provider.  Thank you for allowing us to be a part of your care.       Sabas SousBero, Marchele Decock M, MD 02/23/19 2036

## 2019-02-23 NOTE — ED Notes (Signed)
Patient is being discharged from the [facility] and sent to the Emergency Department per private vehicle, as patient has refused transport via wheelchair with staff. Per Dr Alphonzo Cruise, patient is stable but in need of higher level of care due to neck injury sustained during fall today. Patient is aware and verbalizes understanding of plan of care.  Vitals:   02/23/19 1412  BP: 135/84  Pulse: 86  Resp: 16  Temp: 98.6 F (37 C)  SpO2: 100%

## 2019-02-23 NOTE — ED Provider Notes (Signed)
HPI  SUBJECTIVE:  Brittany Sanford is a 58 y.o. female who presents with neck pain, inability to move her neck status post fall down 2 stairs.  This occurred immediately prior to evaluation.  States that she came straight here after the fall.  She estimates the height of the stairs was 4 feet total.  States that she was carrying a glass fish tank down the stairs, her right foot gave out and she fell down the stairs.  States that she hit front of her head on the car which forcefully extended her neck backwards.  She reports immediate constant shooting neck pain.  No loss of consciousness.  She reports nausea, but no vomiting.  She reports headache.  States that she is unable to hold her head up or sit up straight secondary to the pain.  She denies amnesia.  No vomiting.  No new numbness tingling weakness in her arms and legs.  No chest pain, shortness of breath.  She denies alcohol/drug use today.  She has a past medical history of alcohol abuse, polysubstance abuse.  No history of osteoporosis, anticoagulant or antiplatelet use.    Past Medical History:  Diagnosis Date  . ADHD (attention deficit hyperactivity disorder)   . Attention deficit disorder   . Bipolar 1 disorder ([place])   . Bronchitis   . Substance abuse [facility])     Past Surgical History:  Procedure Laterality Date  . ORIF WRIST FRACTURE Left 06/04/2018   Procedure: OPEN REDUCTION INTERNAL FIXATION (ORIF) WRIST FRACTURE;  Surgeon: Renette Butters, MD;  Location: [facility];  Service: Orthopedics;  Laterality: Left;    Family History  Problem Relation Age of Onset  . COPD Mother   . Cancer Father     Social History   Tobacco Use  . Smoking status: Former Smoker    Packs/day: 1.00    Types: Cigarettes    Quit date: 01/05/2015    Years since quitting: 4.1  . Smokeless tobacco: Never Used  Substance Use Topics  . Alcohol use: Yes    Comment: 12 pack daily  . Drug use: Yes    Types: Cocaine, Benzodiazepines   Comment: daily use    No current facility-administered medications for this encounter.   Current Outpatient Medications:  .  escitalopram (LEXAPRO) 10 MG tablet, Take 10 mg by mouth daily., Disp: , Rfl:  .  gabapentin (NEURONTIN) 300 MG capsule, Take 300 mg by mouth 3 (three) times daily., Disp: , Rfl:  .  ibuprofen (ADVIL,MOTRIN) 200 MG tablet, Take 200 mg by mouth every 6 (six) hours as needed for moderate pain. , Disp: , Rfl:  .  loratadine (CLARITIN) 10 MG tablet, Take 10 mg by mouth daily., Disp: , Rfl:  .  omeprazole (PRILOSEC) 20 MG capsule, Take 1 capsule (20 mg total) by mouth daily. 30 days for gastroprotection while taking NSAIDs., Disp: 30 capsule, Rfl: 0 .  ondansetron (ZOFRAN) 4 MG tablet, Take 1 tablet (4 mg total) by mouth every 8 (eight) hours as needed for nausea or vomiting., Disp: 20 tablet, Rfl: 0  Allergies  Allergen Reactions  . Darvocet [Propoxyphene N-Acetaminophen] Nausea And Vomiting  . Propoxyphene Nausea And Vomiting     ROS  As noted in HPI.   Physical Exam  BP 135/84 (BP Location: Left Arm)   Pulse 86   Temp 98.6 F (37 C) (Oral)   Resp 16   SpO2 100%   Constitutional: Well developed, well nourished, appears uncomfortable.  Sitting hunched over.  She is unable to lift her head, move her neck. Eyes: PERRLA, EOMI, conjunctiva normal bilaterally HENT: Normocephalic, atraumatic,mucus membranes moist Neck: Positive C-spine tenderness particularly in the upper C-spine.  Positive bilateral trapezial tenderness, muscle spasm.   Respiratory: Normal inspiratory effort, lungs clear bilaterally Cardiovascular: Normal rate, regular rhythm, no murmurs rubs or gallops GI: nondistended soft.  Nontender. Back: No tenderness along the T, L-spine. Skin: Multiple superficial abrasions on her lower extremities.  Superficial laceration of her right hip. Musculoskeletal: no deformities Neurologic: Alert & oriented x 3, no focal neuro deficits.  Moving all  extremities equally.  Absent sensation in her right lower extremity, patient states that this is not new.  Speech fluent. Psychiatric: Speech and behavior appropriate   ED Course   Medications - No data to display  Orders Placed This Encounter  Procedures  . Apply cervical collar hard    Standing Status:   Standing    Number of Occurrences:   1    No results found for this or any previous visit (from the past 24 hour(s)). No results found.  ED Clinical Impression  1. Injury of neck, initial encounter   2. Fall, initial encounter   3. Multiple abrasions      ED Assessment/Plan  Patient has diffuse C-spine tenderness particularly along the upper C-spine.  She also has bilateral trapezial spasm.  She is unable to sit up straight and hold her neck up for me.  Placing in a cervical collar, transferring to the emergency department for comprehensive evaluation.  May need additional imaging beyond C-spine.  She has a superficial laceration to her right hip, offered to Steri-Strip this prior to her departure, but she declined.  She states that her primary concern is her neck.  Gust rationale for transfer to the emergency department.  Instructed her to keep the collar on until her C-spine is cleared.  She agrees with plan..   No orders of the defined types were placed in this encounter.   *This clinic note was created using Dragon dictation software. Therefore, there may be occasional mistakes despite careful proofreading.   ?    Domenick Gong, MD 02/23/19 1529

## 2019-02-24 NOTE — ED Notes (Signed)
This RN wasted 0.5 mg of Dilaudid in sharps witnessed by Chong Sicilian, Therapist, sports

## 2019-02-27 ENCOUNTER — Other Ambulatory Visit: Payer: Self-pay

## 2019-02-28 ENCOUNTER — Other Ambulatory Visit: Payer: Self-pay | Admitting: Sports Medicine

## 2019-02-28 ENCOUNTER — Other Ambulatory Visit: Payer: Self-pay

## 2019-02-28 ENCOUNTER — Ambulatory Visit (INDEPENDENT_AMBULATORY_CARE_PROVIDER_SITE_OTHER): Payer: Medicaid Other

## 2019-02-28 ENCOUNTER — Encounter: Payer: Self-pay | Admitting: Sports Medicine

## 2019-02-28 ENCOUNTER — Ambulatory Visit (INDEPENDENT_AMBULATORY_CARE_PROVIDER_SITE_OTHER): Payer: Medicaid Other | Admitting: Sports Medicine

## 2019-02-28 DIAGNOSIS — M216X1 Other acquired deformities of right foot: Secondary | ICD-10-CM | POA: Diagnosis not present

## 2019-02-28 DIAGNOSIS — M79671 Pain in right foot: Secondary | ICD-10-CM

## 2019-02-28 DIAGNOSIS — M21619 Bunion of unspecified foot: Secondary | ICD-10-CM

## 2019-02-28 DIAGNOSIS — M19071 Primary osteoarthritis, right ankle and foot: Secondary | ICD-10-CM

## 2019-02-28 DIAGNOSIS — M5410 Radiculopathy, site unspecified: Secondary | ICD-10-CM

## 2019-02-28 DIAGNOSIS — Z9181 History of falling: Secondary | ICD-10-CM

## 2019-02-28 DIAGNOSIS — M792 Neuralgia and neuritis, unspecified: Secondary | ICD-10-CM

## 2019-02-28 MED ORDER — DICLOFENAC SODIUM 75 MG PO TBEC: 75.0000 mg | DELAYED_RELEASE_TABLET | Freq: Two times a day (BID) | ORAL | 0 refills | Status: DC

## 2019-02-28 NOTE — Progress Notes (Signed)
Subjective: Brittany Sanford is a 58 y.o. female patient who presents to office for evaluation of Righ foot pain.  Patient reports that she has some pain at the right big toe joint for years 9 out of 10 off and on worse with shoes with some redness and swelling also admits that she has a new injury fell on Sunday and injured the lateral side of her right foot reports that she went to the doctor and they did x-rays and nothing was broken but she is still having worsening numbness and tingling and stiffness to all toes on the right patient reports that she had the symptoms prior to her fall but now that she is feel the symptoms are worse patient also reports that she thought she broke her neck and is wearing a neck brace due to her fall.  Patient also admits to a history of pinched nerve issues in the back. Patient denies any other pedal complaints.   Review of Systems  All other systems reviewed and are negative.    Patient Active Problem List   Diagnosis Date Noted  . Bacteremia due to Escherichia coli 01/11/2015  . Bacterial vaginosis 01/11/2015  . Crack cocaine use 01/11/2015  . Lymphopenia 01/11/2015  . Tobacco abuse 01/11/2015  . Pyelonephritis 09/06/2014  . SOB (shortness of breath) 09/06/2014  . History of bipolar disorder 04/15/2014  . Alcohol use disorder, severe, dependence (HCC) 04/14/2014  . Bipolar affective disorder (HCC) 04/14/2014  . Substance induced mood disorder (HCC) 04/14/2014  . Alcohol abuse   . Polysubstance abuse (HCC)     Current Outpatient Medications on File Prior to Visit  Medication Sig Dispense Refill  . albuterol (VENTOLIN HFA) 108 (90 Base) MCG/ACT inhaler SMARTSIG:2 Puff(s) By Mouth Every 4-6 Hours PRN    . cyclobenzaprine (FLEXERIL) 10 MG tablet Take 1 tablet (10 mg total) by mouth 2 (two) times daily as needed for muscle spasms. 20 tablet 0  . escitalopram (LEXAPRO) 10 MG tablet Take 10 mg by mouth daily.    . fluconazole (DIFLUCAN) 150 MG tablet TAKE 1  TABLET BY MOUTH TODAY AND ANOTHER TABLET IN 3 5 DAYS    . fluticasone (FLONASE) 50 MCG/ACT nasal spray Place 1 spray into both nostrils 2 (two) times daily.    Marland Kitchen gabapentin (NEURONTIN) 300 MG capsule Take 300 mg by mouth 3 (three) times daily.    Marland Kitchen ibuprofen (ADVIL,MOTRIN) 200 MG tablet Take 200 mg by mouth every 6 (six) hours as needed for moderate pain.     Marland Kitchen lidocaine (LIDODERM) 5 % Place 1 patch onto the skin daily. Remove & Discard patch within 12 hours or as directed by MD (Patient not taking: Reported on 02/28/2019) 5 patch 0  . loratadine (CLARITIN) 10 MG tablet Take 10 mg by mouth daily.    . mirtazapine (REMERON) 30 MG tablet Take 30 mg by mouth at bedtime.    Marland Kitchen omeprazole (PRILOSEC) 20 MG capsule Take 1 capsule (20 mg total) by mouth daily. 30 days for gastroprotection while taking NSAIDs. 30 capsule 0  . ondansetron (ZOFRAN) 4 MG tablet Take 1 tablet (4 mg total) by mouth every 8 (eight) hours as needed for nausea or vomiting. 20 tablet 0   No current facility-administered medications on file prior to visit.     Allergies  Allergen Reactions  . Darvocet [Propoxyphene N-Acetaminophen] Nausea And Vomiting  . Propoxyphene Nausea And Vomiting    Objective:  General: Alert and oriented x3 in no acute distress  Dermatology: No open lesions bilateral lower extremities, no webspace macerations, no ecchymosis bilateral, all nails x 10 are well manicured.  Vascular: Dorsalis Pedis and Posterior Tibial pedal pulses 2/4, Capillary Fill Time 3 seconds, (+) pedal hair growth bilateral, no edema bilateral lower extremities, Temperature gradient within normal limits.  Neurology: Gross sensation intact via light touch bilateral, extreme burning numbness to all toes on the right.  Patient reports that she is unstable on her feet and falls a lot.  History of pinched nerve cervical level need to rule out issue of pinched nerve at the level of the lumbar spine that could be adding to her severe  symptoms.  Musculoskeletal: Mild tenderness with palpation right bunion deformity there is no limitation with range of motion at first MPJ on right except at end range of motion consistent with history of arthritis.  There is pain to palpation at the dorsal and lateral aspect of the right foot patient has a history of fall and instability and pain is noted to be increased when attention is drawn to the foot however patient does not flinch when I am touching her foot and she is not paying attention to where I am touching consistent with likely pain out of proportion.  Xrays  Right foot   Impression: Intermetatarsal angle above normal limits with bunion and arthritis noted there is no fracture or dislocation no other acute findings.      Assessment and Plan: Problem List Items Addressed This Visit    None    Visit Diagnoses    Arthritis of first metatarsophalangeal (MTP) joint of right foot    -  Primary   Relevant Medications   diclofenac (VOLTAREN) 75 MG EC tablet   Neuritis       History of fall       Foot pain, right       Radiculopathy, unspecified spinal region           -Complete examination performed -Xrays reviewed -Discussed treatement options; discussed HAV deformity;conservative and  Surgical management; risks, benefits, alternatives discussed. All patient's questions answered. -After verbal consent, injected into right/left 1st MTPJ for symptomatic relief 1cc lidocaine plain, 1cc marcaine plain, 0.5cc dexamethasone without complication.   -Rx -Recommend continue with good supportive shoes and inserts.  -Patient to return to office as needed or sooner if condition worsens.  Landis Martins, DPM

## 2019-03-03 ENCOUNTER — Telehealth: Payer: Self-pay | Admitting: *Deleted

## 2019-03-03 DIAGNOSIS — Z9181 History of falling: Secondary | ICD-10-CM

## 2019-03-03 DIAGNOSIS — M5410 Radiculopathy, site unspecified: Secondary | ICD-10-CM

## 2019-03-03 DIAGNOSIS — M792 Neuralgia and neuritis, unspecified: Secondary | ICD-10-CM

## 2019-03-03 DIAGNOSIS — M19071 Primary osteoarthritis, right ankle and foot: Secondary | ICD-10-CM

## 2019-03-03 NOTE — Telephone Encounter (Signed)
Faxed orders and demographics to [facility].

## 2019-03-03 NOTE — Telephone Encounter (Signed)
-----   Message from [place], [place] sent at 02/28/2019 12:10 PM EST ----- Regarding: Nerve conduction test Pain to right foot out of proportion with instability and history of radiculopathy

## 2019-03-05 ENCOUNTER — Other Ambulatory Visit: Payer: Self-pay | Admitting: Sports Medicine

## 2019-03-05 DIAGNOSIS — M19071 Primary osteoarthritis, right ankle and foot: Secondary | ICD-10-CM

## 2019-04-15 ENCOUNTER — Telehealth: Payer: Self-pay | Admitting: *Deleted

## 2019-04-15 DIAGNOSIS — M5410 Radiculopathy, site unspecified: Secondary | ICD-10-CM

## 2019-04-15 DIAGNOSIS — M792 Neuralgia and neuritis, unspecified: Secondary | ICD-10-CM

## 2019-04-15 NOTE — Telephone Encounter (Signed)
Left message requesting a call back to discuss results. 

## 2019-04-15 NOTE — Telephone Encounter (Signed)
-----   Message from [place], [place] sent at 04/15/2019  7:19 AM EST ----- Regarding: Nerve studies Val  Will you let patient know that her nerve studies were positive and that her issues are coming from compression of the nerve at the level of her back. Patient needs to follow up with neurology for further recommendations/care. Thanks Dr. Marylene Land

## 2019-04-16 NOTE — Telephone Encounter (Signed)
Pt called for results. I called pt and informed of Dr. Wynema Birch 04/15/2019 7:19am results and referral information. Pt states understanding. Referral, clinicals and demographics faxed to [facility].

## 2019-04-16 NOTE — Telephone Encounter (Signed)
-----   Message from Titorya Stover, DPM sent at 04/15/2019  7:19 AM EST ----- Regarding: Nerve studies Val  Will you let patient know that her nerve studies were positive and that her issues are coming from compression of the nerve at the level of her back. Patient needs to follow up with neurology for further recommendations/care. Thanks Dr. Stover  

## 2019-10-20 ENCOUNTER — Telehealth: Payer: Self-pay

## 2019-10-20 NOTE — Telephone Encounter (Signed)
We did not refer her to PT, her doctor back in March referred her not our office she will have to call them: Neurology Curt Bears, MD   [address]   [address]   [place], [place] [address]   [phone]   [phone] (Fax)

## 2019-10-20 NOTE — Telephone Encounter (Signed)
PT called and stated she had a referral for PT but all the way to [place] and would like to get the location changed to [facility]

## 2019-10-21 NOTE — Telephone Encounter (Signed)
Called and LVM to pt stating that we did not made the referral to PT, that her Dr back in March referred her to PT and she would have to contact them. Number was provided of neurology

## 2021-01-18 ENCOUNTER — Other Ambulatory Visit: Payer: Self-pay | Admitting: Obstetrics and Gynecology

## 2021-01-18 DIAGNOSIS — N6310 Unspecified lump in the right breast, unspecified quadrant: Secondary | ICD-10-CM

## 2021-01-19 ENCOUNTER — Other Ambulatory Visit: Payer: Self-pay | Admitting: Obstetrics and Gynecology

## 2021-01-19 DIAGNOSIS — N6310 Unspecified lump in the right breast, unspecified quadrant: Secondary | ICD-10-CM

## 2021-01-21 ENCOUNTER — Other Ambulatory Visit: Payer: Self-pay

## 2021-01-21 ENCOUNTER — Ambulatory Visit
Admission: RE | Admit: 2021-01-21 | Discharge: 2021-01-21 | Disposition: A | Discharge status: Home / Self Care | Payer: Medicaid Other | Source: Ambulatory Visit | Attending: Obstetrics and Gynecology | Admitting: Obstetrics and Gynecology

## 2021-01-21 DIAGNOSIS — N6310 Unspecified lump in the right breast, unspecified quadrant: Secondary | ICD-10-CM

## 2021-01-25 DIAGNOSIS — M21611 Bunion of right foot: Secondary | ICD-10-CM | POA: Insufficient documentation

## 2021-02-11 ENCOUNTER — Ambulatory Visit: Payer: Medicaid Other | Admitting: Sports Medicine

## 2021-02-25 DIAGNOSIS — S12100K Unspecified displaced fracture of second cervical vertebra, subsequent encounter for fracture with nonunion: Secondary | ICD-10-CM | POA: Insufficient documentation

## 2021-02-25 DIAGNOSIS — S12031D Nondisplaced posterior arch fracture of first cervical vertebra, subsequent encounter for fracture with routine healing: Secondary | ICD-10-CM | POA: Insufficient documentation

## 2021-02-25 DIAGNOSIS — M5412 Radiculopathy, cervical region: Secondary | ICD-10-CM | POA: Insufficient documentation

## 2021-02-25 DIAGNOSIS — M542 Cervicalgia: Secondary | ICD-10-CM | POA: Insufficient documentation

## 2021-03-08 ENCOUNTER — Ambulatory Visit: Payer: Medicaid Other | Admitting: Sports Medicine

## 2021-03-25 ENCOUNTER — Ambulatory Visit: Payer: Medicaid Other | Admitting: Sports Medicine

## 2021-03-25 ENCOUNTER — Ambulatory Visit (INDEPENDENT_AMBULATORY_CARE_PROVIDER_SITE_OTHER): Payer: Medicaid Other

## 2021-03-25 ENCOUNTER — Encounter: Payer: Self-pay | Admitting: Sports Medicine

## 2021-03-25 DIAGNOSIS — M79671 Pain in right foot: Secondary | ICD-10-CM

## 2021-03-25 DIAGNOSIS — M216X1 Other acquired deformities of right foot: Secondary | ICD-10-CM | POA: Diagnosis not present

## 2021-03-25 DIAGNOSIS — M2061 Acquired deformities of toe(s), unspecified, right foot: Secondary | ICD-10-CM

## 2021-03-25 DIAGNOSIS — M19079 Primary osteoarthritis, unspecified ankle and foot: Secondary | ICD-10-CM

## 2021-03-25 DIAGNOSIS — S90811A Abrasion, right foot, initial encounter: Secondary | ICD-10-CM | POA: Diagnosis not present

## 2021-03-25 MED ORDER — MELOXICAM 15 MG PO TABS: 15.0000 mg | ORAL_TABLET | Freq: Every day | ORAL | 0 refills | Status: DC

## 2021-03-25 NOTE — Progress Notes (Signed)
Subjective: Brittany Sanford is a 60 y.o. female patient who returns office for follow-up evaluation of right foot pain.  Patient reports that she has pain at the big toe joint still that radiates across the foot and states that she has a new problem where she dropped a metal table while trying to renovate her RV on the top of the right foot.  Patient denies nausea vomiting fever chills or any constitutional symptoms at this time and states that she has been using antibiotic ointment on the scrape that now seems to be healed.  Review of Systems  All other systems reviewed and are negative.   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 injury with nonunion 02/25/2021   Closed nondisplaced fracture of posterior arch of first cervical vertebra with routine healing 02/25/2021   Bunion of right foot 01/25/2021   Benzodiazepine abuse ([place]) 01/17/2017   Bacteremia due to Escherichia coli 01/11/2015   Bacterial vaginosis 01/11/2015   Crack cocaine use 01/11/2015   Lymphopenia 01/11/2015   Tobacco abuse 01/11/2015   Pyelonephritis 09/06/2014   SOB (shortness of breath) 09/06/2014   History of bipolar disorder 04/15/2014   Alcohol use disorder, severe, dependence ([place]) 04/14/2014   Bipolar affective disorder ([place]) 04/14/2014   Substance induced mood disorder ([place]) 04/14/2014   Alcohol abuse    Polysubstance abuse ([place])     Current Outpatient Medications on File Prior to Visit  Medication Sig Dispense Refill   cyclobenzaprine (FLEXERIL) 10 MG tablet Take 1 tablet by mouth 2 (two) times daily.     diclofenac Sodium (VOLTAREN) 1 % GEL 1-2 gram application 4 times per day prn pain     loratadine (CLARITIN) 10 MG tablet Take 1 tablet by mouth daily.     mupirocin ointment (BACTROBAN) 2 % Apply TID to wound for 10 days     ADVAIR DISKUS 250-50 MCG/ACT AEPB 2 (two) times daily.      albuterol (VENTOLIN HFA) 108 (90 Base) MCG/ACT inhaler SMARTSIG:2 Puff(s) By Mouth Every 4-6 Hours PRN     BINAXNOW COVID-19 AG HOME TEST KIT Use as Directed on the Package     brexpiprazole (REXULTI) 1 MG TABS tablet Take by mouth.     cyclobenzaprine (FLEXERIL) 10 MG tablet Take 1 tablet (10 mg total) by mouth 2 (two) times daily as needed for muscle spasms. 20 tablet 0   diclofenac (VOLTAREN) 75 MG EC tablet Take 1 tablet (75 mg total) by mouth 2 (two) times daily. 30 tablet 0   escitalopram (LEXAPRO) 10 MG tablet Take 10 mg by mouth daily.     fluconazole (DIFLUCAN) 150 MG tablet TAKE 1 TABLET BY MOUTH TODAY AND ANOTHER TABLET IN 3 5 DAYS     fluticasone (FLONASE) 50 MCG/ACT nasal spray Place 1 spray into both nostrils 2 (two) times daily.     gabapentin (NEURONTIN) 300 MG capsule Take 300 mg by mouth 3 (three) times daily.     ibuprofen (ADVIL,MOTRIN) 200 MG tablet Take 200 mg by mouth every 6 (six) hours as needed for moderate pain.      lidocaine (LIDODERM) 5 % Place 1 patch onto the skin daily. Remove & Discard patch within 12 hours or as directed by MD (Patient not taking: Reported on 02/28/2019) 5 patch 0   loratadine (CLARITIN) 10 MG tablet Take 10 mg by mouth daily.  mirtazapine (REMERON) 15 MG tablet Take 15 mg by mouth at bedtime.     mirtazapine (REMERON) 30 MG tablet Take 30 mg by mouth at bedtime.     omeprazole (PRILOSEC) 20 MG capsule Take 1 capsule (20 mg total) by mouth daily. 30 days for gastroprotection while taking NSAIDs. 30 capsule 0   ondansetron (ZOFRAN) 4 MG tablet Take 1 tablet (4 mg total) by mouth every 8 (eight) hours as needed for nausea or vomiting. 20 tablet 0   No current facility-administered medications on file prior to visit.    Allergies  Allergen Reactions   Darvocet [Propoxyphene N-Acetaminophen] Nausea And Vomiting   Propoxyphene Nausea And Vomiting    Objective:  General: Alert and oriented x3 in no acute distress  Dermatology: Dry scab  noted to the dorsum of the right foot from previous accidental dropping a table to the area.  No open lesions bilateral lower extremities, no webspace macerations, no ecchymosis bilateral, all nails x 10 are well manicured.  Vascular: Dorsalis Pedis and Posterior Tibial pedal pulses 2/4, Capillary Fill Time 3 seconds, (+) pedal hair growth bilateral, no edema bilateral lower extremities, Temperature gradient within normal limits.  Neurology: Gross sensation intact via light touch bilateral, extreme burning numbness to all toes on the right.  Patient reports that she is unstable on her feet and falls a lot.  History of pinched nerve cervical level need to rule out issue of pinched nerve at the level of the lumbar spine that could be adding to her severe symptoms.  Musculoskeletal: Mild tenderness with palpation right bunion deformity there is no limitation with range of motion at first MPJ on right except at end range of motion consistent with history of arthritis.  There is pain to palpation at the dorsal and lateral aspect of the right foot patient has a history of fall and instability and pain is noted to be increased when attention is drawn to the foot however patient does not flinch when I am touching her foot and she is not paying attention to where I am touching consistent with likely pain out of proportion.  Xrays  Right foot   Impression: Intermetatarsal angle above normal limits with bunion and arthritis noted there is no fracture or dislocation no other acute findings joint space narrowing and arthritis noted at the first MPJ and IPJ joints.      Assessment and Plan: Problem List Items Addressed This Visit   None Visit Diagnoses     Acquired deformity of right toe    -  Primary   Relevant Orders   DG Foot Complete Right   Arthritis of metatarsophalangeal (MTP) joint of great toe       Relevant Medications   cyclobenzaprine (FLEXERIL) 10 MG tablet   meloxicam (MOBIC) 15 MG tablet    Right foot pain       Abrasion, right foot, initial encounter       2 weeks ago        -Complete examination performed -Xrays reviewed -Discussed with patient that she is not a surgery candidate at this time until she quit smoking -Rx Mobic -Dispensed bunion cushion for patient to use as directed -Recommend continue with good supportive shoes and inserts that do not rub the toe joint meanwhile.  -Patient to return to office in 6 weeks or sooner if condition worsens.  Advised patient at this visit we will reevaluate and to make sure that her abrasion has healed and to make sure that  she has stopped smoking in order to determine if she is a surgery candidate in the future.  Landis Martins, DPM

## 2021-05-10 ENCOUNTER — Ambulatory Visit: Payer: Medicaid Other | Admitting: Sports Medicine

## 2021-06-10 ENCOUNTER — Ambulatory Visit: Payer: Medicaid Other | Admitting: Sports Medicine

## 2021-06-11 ENCOUNTER — Other Ambulatory Visit: Payer: Self-pay | Admitting: Sports Medicine

## 2021-06-13 NOTE — Telephone Encounter (Signed)
Please advise, duplicate therapy with this medication.

## 2021-07-18 ENCOUNTER — Ambulatory Visit: Payer: Medicaid Other | Admitting: Podiatry

## 2021-08-01 ENCOUNTER — Ambulatory Visit (INDEPENDENT_AMBULATORY_CARE_PROVIDER_SITE_OTHER): Payer: Medicaid Other | Admitting: Podiatry

## 2021-08-01 ENCOUNTER — Encounter: Payer: Self-pay | Admitting: Podiatry

## 2021-08-01 DIAGNOSIS — M19079 Primary osteoarthritis, unspecified ankle and foot: Secondary | ICD-10-CM | POA: Diagnosis not present

## 2021-08-01 DIAGNOSIS — M21611 Bunion of right foot: Secondary | ICD-10-CM

## 2021-08-01 MED ORDER — ACETAMINOPHEN ER 650 MG PO TBCR: 650.0000 mg | EXTENDED_RELEASE_TABLET | Freq: Three times a day (TID) | ORAL | 0 refills | Status: AC | PRN

## 2021-08-01 NOTE — Progress Notes (Signed)
?  Subjective:  ?Patient ID: Brittany Sanford, female    DOB: [birth year],   MRN: [id number] ? ?Chief Complaint  ?Patient presents with  ? Foot Pain  ?  The bunion throbs and has a brace on the right foot and I want to talk about surgery   ? ? ?61 y.o. female presents for concern of right foot bunion. She has seen Dr. Marylene Land in the past and recently saw Dr. Kaylyn Layer for her bunion and foot drop. Relates years ago she had a back injury that resulted in foot drop. Relates tingling and numbness in her feet. Is concerned about her bunion as well.  . Denies any other pedal complaints. Denies n/v/f/c.  ? ?Past Medical History:  ?Diagnosis Date  ? ADHD (attention deficit hyperactivity disorder)   ? Attention deficit disorder   ? Bipolar 1 disorder (HCC)   ? Bronchitis   ? Substance abuse (HCC)   ? ? ?Objective:  ?Physical Exam: ?Vascular: DP/PT pulses 2/4 bilateral. CFT <3 seconds. Normal hair growth on digits. No edema.  ?Skin. No lacerations or abrasions bilateral feet.  ?Musculoskeletal: MMT 3/5 bilateral lower extremities in DF, PF, Inversion and Eversion. Deceased ROM in DF of ankle joint. HAV deformity noted to right foot. No pain to palpation of medial eminence. No pain with ROM. There is some pain with palpation around PT. Positive tinels slightly.  ?Neurological: Sensation intact to light touch.  ? ?Assessment:  ? ?1. Bunion, right   ?2. Arthritis of metatarsophalangeal (MTP) joint of great toe   ? ? ? ?Plan:  ?Patient was evaluated and treated and all questions answered. ?-Xrays reviewed from prior. Moderate bunion deformity noted with degenerative changes noted.  ?-Discussed HAV and drop foot.  and treatment options;conservative and surgical management; risks, benefits, alternatives discussed. All patient's questions answered. ?-Discussed padding and wide shoe gear.   ?-Recommend continue with good supportive shoes and inserts.  ?-Getting fitted for new AFO next week.  ?-Discussed surgical options. Do not feel that  bunion surgery would actually help with her pain as most of her pain seems nerve related. Discussed support and managing nerve pain.  ?-Patient to return to office as needed or sooner if condition worsens. ? ? ?Louann Sjogren, DPM  ? ? ?

## 2021-09-01 ENCOUNTER — Other Ambulatory Visit: Payer: Self-pay | Admitting: Sports Medicine

## 2021-09-01 NOTE — Telephone Encounter (Signed)
Please advise,duplicate therapy

## 2023-10-13 ENCOUNTER — Other Ambulatory Visit: Payer: Self-pay | Admitting: Podiatry
